# Patient Record
Sex: Male | Born: 1940 | Race: White | Hispanic: No | Marital: Married | Smoking: Never smoker
Health system: Southern US, Community
[De-identification: ages and names within clinical notes are randomized; demographics above are authoritative.]

## PROBLEM LIST (undated history)

## (undated) DIAGNOSIS — I2583 Coronary atherosclerosis due to lipid rich plaque: Secondary | ICD-10-CM

## (undated) DIAGNOSIS — I10 Essential (primary) hypertension: Secondary | ICD-10-CM

## (undated) DIAGNOSIS — Z950 Presence of cardiac pacemaker: Secondary | ICD-10-CM

## (undated) DIAGNOSIS — I499 Cardiac arrhythmia, unspecified: Secondary | ICD-10-CM

## (undated) DIAGNOSIS — I4891 Unspecified atrial fibrillation: Secondary | ICD-10-CM

## (undated) DIAGNOSIS — M1990 Unspecified osteoarthritis, unspecified site: Secondary | ICD-10-CM

## (undated) DIAGNOSIS — E785 Hyperlipidemia, unspecified: Secondary | ICD-10-CM

## (undated) DIAGNOSIS — I2510 Atherosclerotic heart disease of native coronary artery without angina pectoris: Secondary | ICD-10-CM

## (undated) DIAGNOSIS — E039 Hypothyroidism, unspecified: Secondary | ICD-10-CM

## (undated) HISTORY — DX: Coronary atherosclerosis due to lipid rich plaque: I25.83

## (undated) HISTORY — DX: Atherosclerotic heart disease of native coronary artery without angina pectoris: I25.10

## (undated) HISTORY — PX: VENTRICULAR CARDIAC PACEMAKER INSERTION: SHX836

## (undated) HISTORY — PX: CORONARY ARTERY BYPASS GRAFT: SHX141

## (undated) HISTORY — PX: INSERT / REPLACE / REMOVE PACEMAKER: SUR710

## (undated) HISTORY — PX: HERNIA REPAIR: SHX51

## (undated) HISTORY — PX: OTHER SURGICAL HISTORY: SHX169

## (undated) HISTORY — PX: HIP FRACTURE SURGERY: SHX118

## (undated) HISTORY — PX: BACK SURGERY: SHX140

## (undated) HISTORY — PX: FRACTURE SURGERY: SHX138

## (undated) HISTORY — DX: Hyperlipidemia, unspecified: E78.5

## (undated) HISTORY — PX: CATARACT EXTRACTION W/ INTRAOCULAR LENS IMPLANT: SHX1309

## (undated) HISTORY — DX: Presence of cardiac pacemaker: Z95.0

---

## 2000-12-08 ENCOUNTER — Ambulatory Visit (HOSPITAL_BASED_OUTPATIENT_CLINIC_OR_DEPARTMENT_OTHER): Admission: RE | Admit: 2000-12-08 | Discharge: 2000-12-08 | Payer: Self-pay | Admitting: Orthopedic Surgery

## 2001-03-16 ENCOUNTER — Ambulatory Visit (HOSPITAL_COMMUNITY): Admission: RE | Admit: 2001-03-16 | Discharge: 2001-03-16 | Payer: Self-pay

## 2004-06-22 ENCOUNTER — Inpatient Hospital Stay (HOSPITAL_COMMUNITY): Admission: EM | Admit: 2004-06-22 | Discharge: 2004-06-27 | Payer: Self-pay | Admitting: *Deleted

## 2004-08-21 DIAGNOSIS — Z951 Presence of aortocoronary bypass graft: Secondary | ICD-10-CM | POA: Insufficient documentation

## 2005-04-02 ENCOUNTER — Ambulatory Visit: Payer: Self-pay | Admitting: *Deleted

## 2005-05-17 ENCOUNTER — Ambulatory Visit (HOSPITAL_COMMUNITY): Admission: RE | Admit: 2005-05-17 | Discharge: 2005-05-17 | Payer: Self-pay

## 2005-07-18 ENCOUNTER — Ambulatory Visit: Payer: Self-pay | Admitting: Internal Medicine

## 2005-07-22 ENCOUNTER — Ambulatory Visit: Payer: Self-pay | Admitting: Internal Medicine

## 2005-09-09 ENCOUNTER — Ambulatory Visit: Payer: Self-pay | Admitting: Internal Medicine

## 2005-10-08 IMAGING — CR DG HIP (WITH OR WITHOUT PELVIS) 2-3V*L*
2 series · 2 of 2 positions shown · non-contrast
Comparison: none

CLINICAL DATA: Left pain after a fall.   
 LEFT HIP, TWO VIEWS ? 06/22/04:
 There is a displaced intertrochanteric fracture of the proximal left femur.

[view not recorded (1 of 2)]
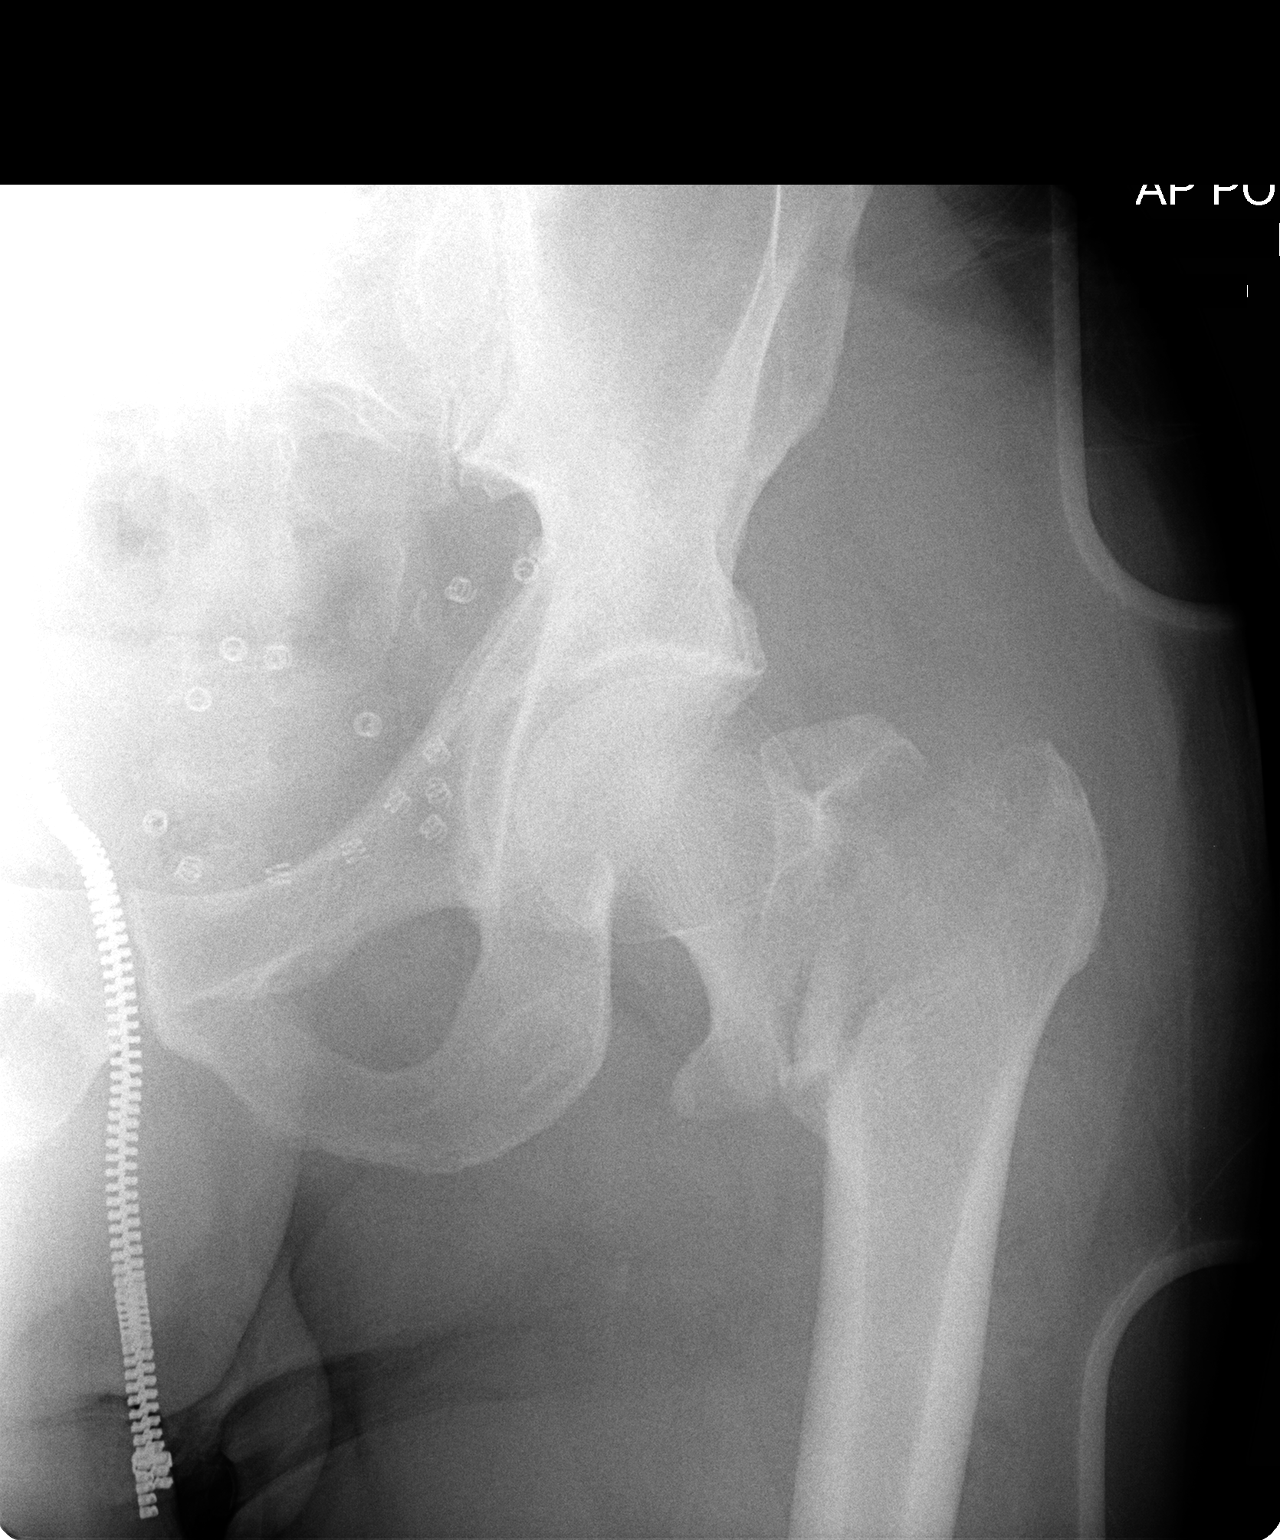

[view not recorded (2 of 2)]
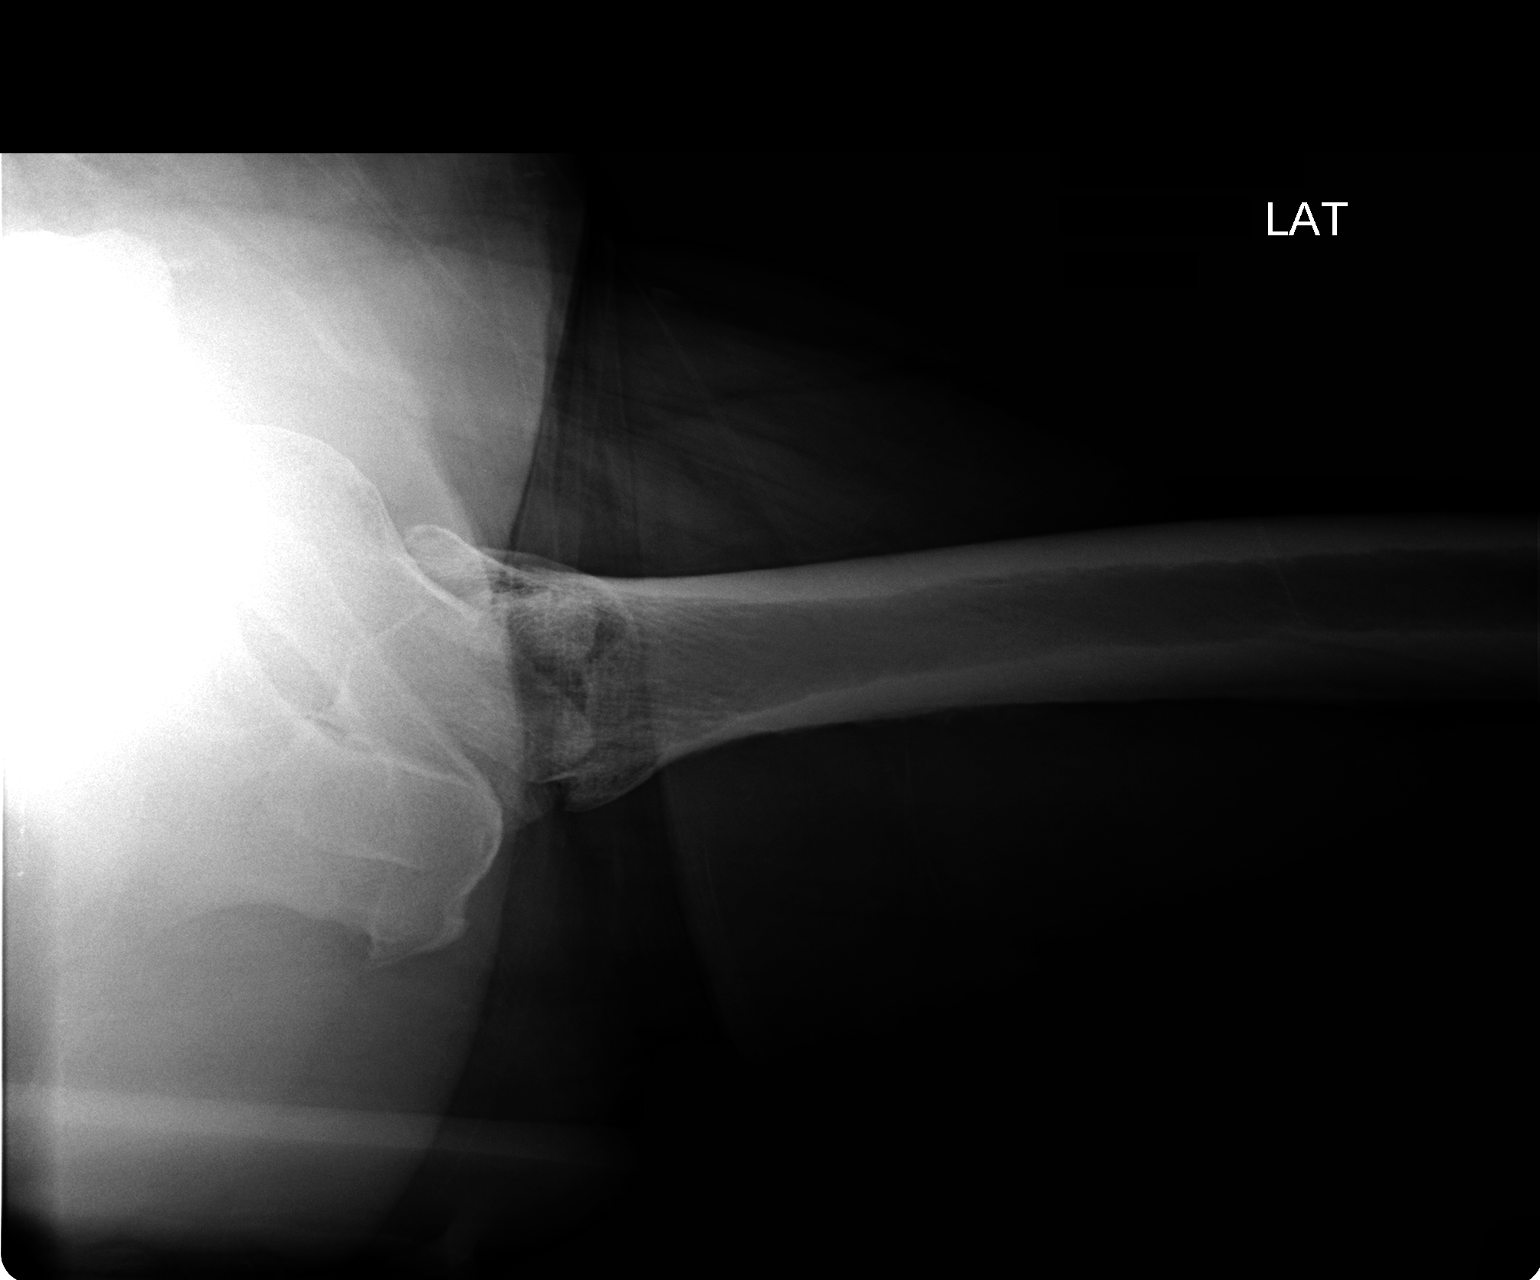

[2 of 2 positions shown; findings below may reference images not displayed]

IMPRESSION: Intertrochanteric fracture of the proximal left femur.

## 2006-05-15 ENCOUNTER — Ambulatory Visit: Payer: Self-pay | Admitting: *Deleted

## 2007-08-31 IMAGING — CR DG SHOULDER 3+V*L*
1 series · 3 of 3 positions shown · non-contrast
Comparison: none

REASON FOR EXAM: pain
COMMENTS:

PROCEDURE:     DXR - DXR SHOULDER LEFT COMPLETE  - May 15, 2006  [DATE]
RESULT:          Views of the LEFT shoulder reveal no evidence of fracture
or dislocation nor significant degenerative change.  The overlying soft
tissues are normal in appearance.

[Series 1: view not recorded · 0.17mm/px · 3 of 3 slices shown]
[im 1/3]
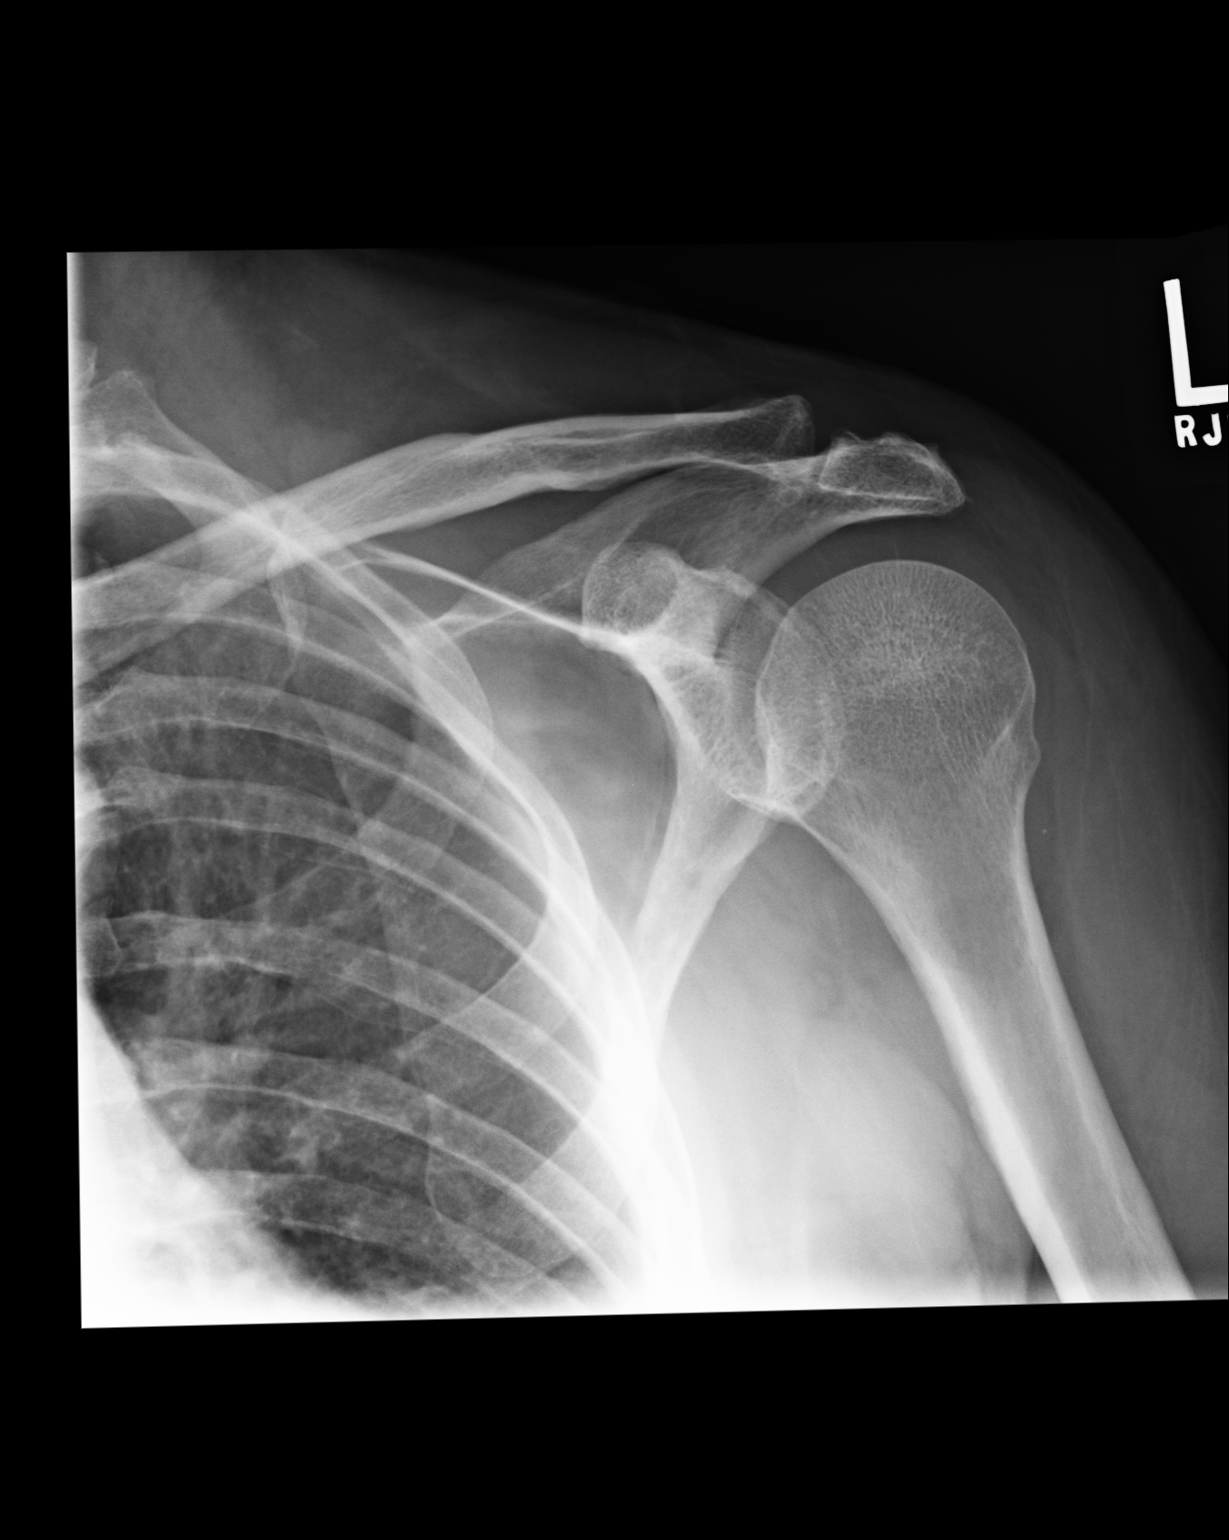
[im 2/3]
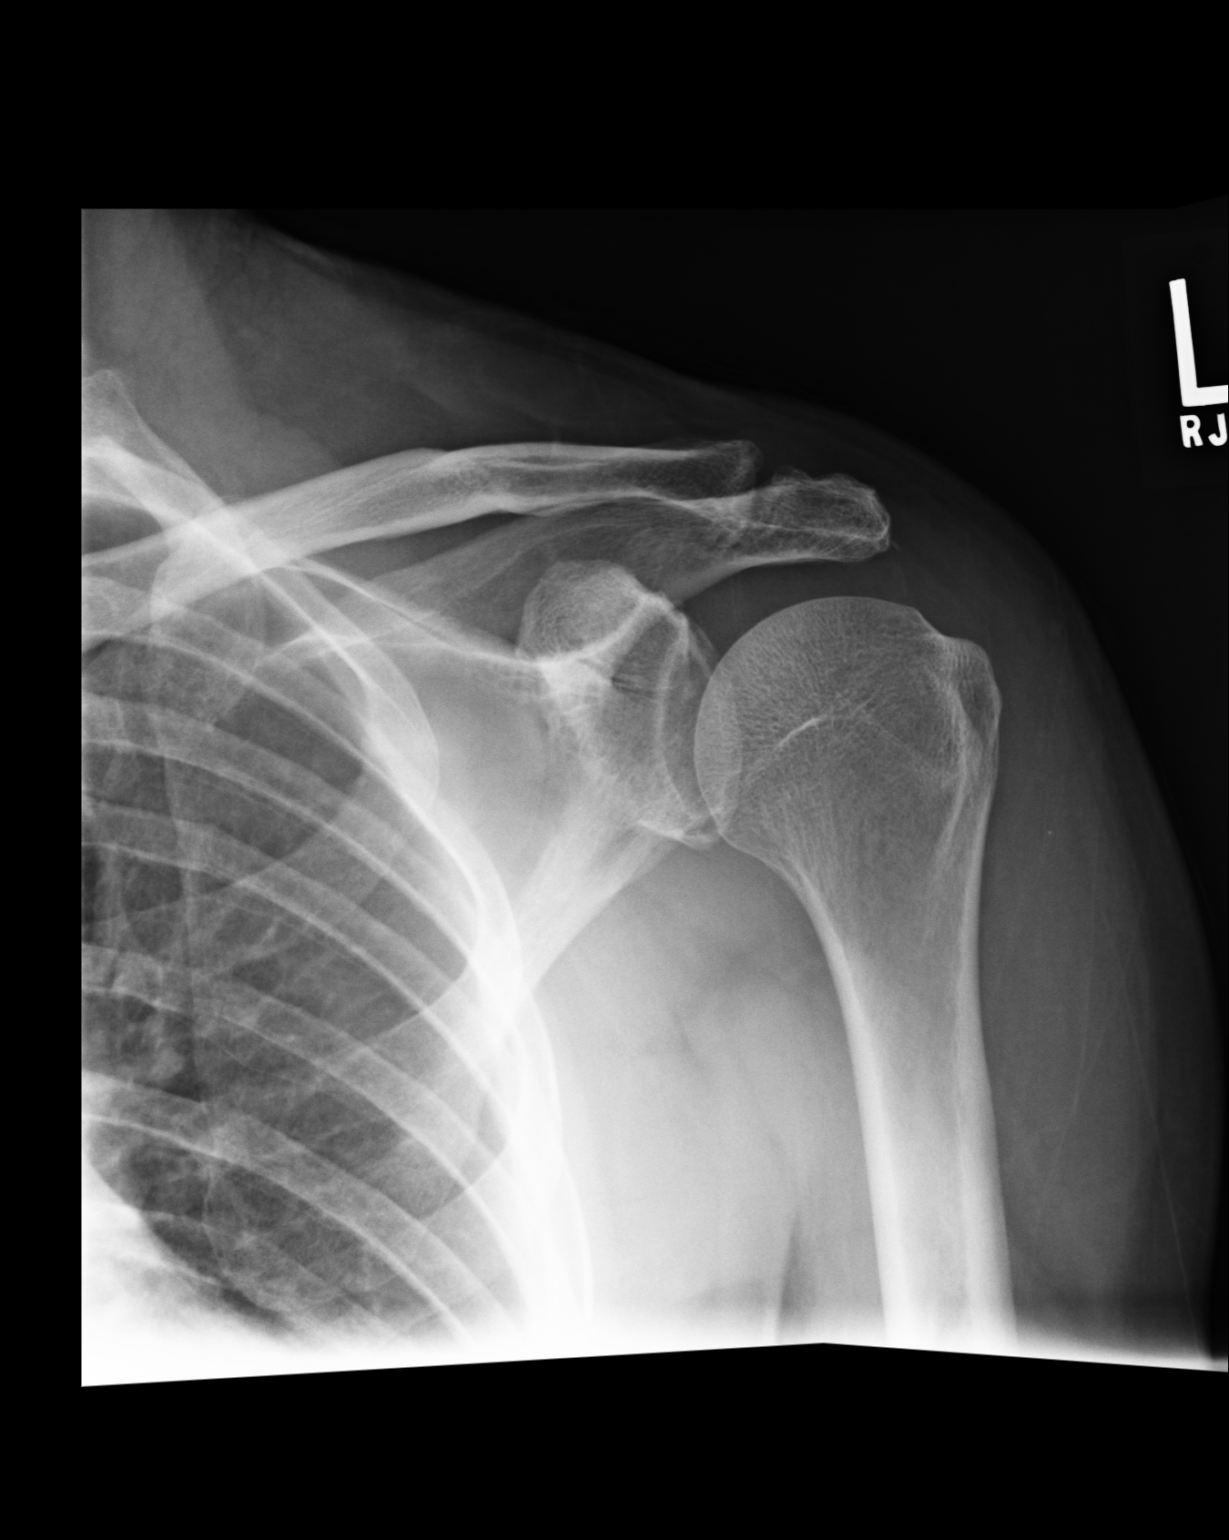
[im 3/3]
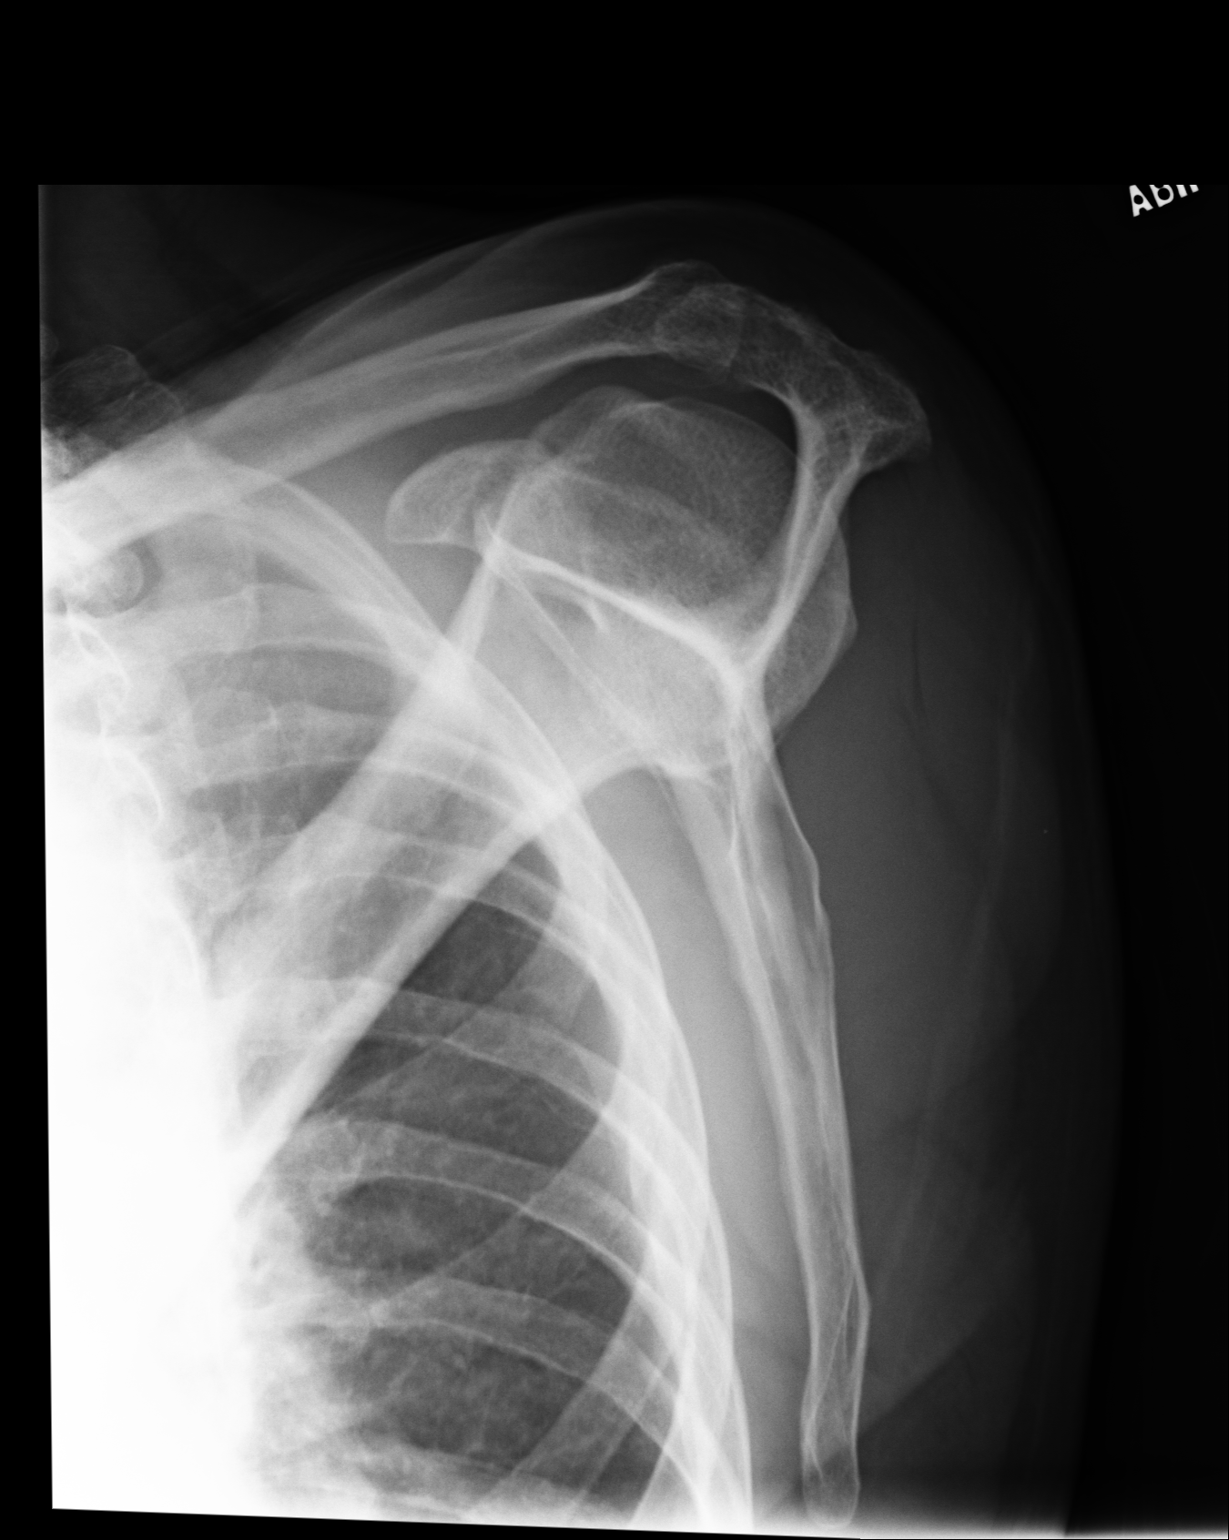

[3 of 3 positions shown; findings below may reference images not displayed]

IMPRESSION: I see no acute bony abnormality of the LEFT shoulder.
If rotator cuff pathology is suspected, further evaluation with MRI would be
a useful next step.

## 2008-07-05 ENCOUNTER — Ambulatory Visit: Payer: Self-pay | Admitting: Gastroenterology

## 2009-04-10 ENCOUNTER — Ambulatory Visit: Payer: Self-pay | Admitting: Family Medicine

## 2009-10-05 ENCOUNTER — Ambulatory Visit (HOSPITAL_COMMUNITY): Admission: RE | Admit: 2009-10-05 | Discharge: 2009-10-05 | Payer: Self-pay | Admitting: Family Medicine

## 2010-12-28 NOTE — Discharge Summary (Signed)
NAME:  [name], [name]               ACCOUNT NO.:  [id number]   MEDICAL RECORD NO.:  [id number]          PATIENT TYPE:  INP   LOCATION:  A329                          FACILITY:  [facility]   PHYSICIAN:  J. Darreld Mclean, M.D. DATE OF BIRTH:  07-31-1941   DATE OF ADMISSION:  06/22/2004  DATE OF DISCHARGE:  11/16/2005LH                                 DISCHARGE SUMMARY   DISCHARGE DIAGNOSES:  1.  Comminuted displaced intertrochanteric fracture of the left hip.  2.  History of hypertension.   OPERATION:  Open treatment, internal fixation of the left intertrochanteric  fracture.   DISPOSITION:  The patient is discharged home, is to follow-up in the office  on July 03, 2004 for removal of staples from his left hip wound with x-  rays of his hip at that time.   DISCHARGE STATUS:  Improved.   DISCHARGE PROGNOSIS:  Good.   DISCHARGE MEDICATIONS:  1.  Vicodin ES one tab q.4 h. p.r.n. for pain.  2.  Levaquin 500 mg one p.o. every day times 10 days.   BRIEF HISTORY AND PHYSICAL:  Please refer to the typewritten history and  physical in the patient's chart.   HOSPITAL COURSE:  On the day of admission, the patient was taken to the  operating room where he underwent open treatment, internal fixation of the  left hip under sterile anesthetic.  He tolerated the procedure well and lost  approximately 200 cc of blood.   Following procedure, he was admitted to the hospital for pain control and  physical therapy.  On his first postoperative day, he was afebrile.  Neurovascular was intact to his left lower extremity.  Labs were within  normal limits.  He was seen by physical therapy and did reasonably well  getting out of bed and working with therapy.   His hypertension was managed by one of the hospitalists here at the  hospital.   The patient continued to do well, remained afebrile in stable condition  throughout his stay.  At the time of discharge, he is walking 30+ feet with  standby  assistance only with a walker.  His wound was inspected on the third  postoperative day and did not show any erythema or drainage.   The patient was discharged home on June 27, 2004, afebrile, and in  satisfactory condition.  Because on the day of discharge he had a slight  redness around the staples to the wound, he was placed on Levaquin for very  superficial cellulitis.  He was asked to call to the hospital beeper system  if he should have any problems.  Otherwise, he will call the office.   He is to be followed by home health provided by [facility].   He has been given the following instructions:  1.  Continue toe-touch weightbearing of the left lower extremity using a      walker at all times.  2.  Keep the wound to his left hip clean and dry.      BC/MEDQ  D:  07/04/2004  T:  07/04/2004  Job:  [id number] 

## 2010-12-28 NOTE — H&P (Signed)
NAME:  [name], [name]               ACCOUNT NO.:  [id number]   MEDICAL RECORD NO.:  [id number]          PATIENT TYPE:  EMS   LOCATION:  ED                            FACILITY:  [facility]   PHYSICIAN:  J. Darreld Mclean, M.D. DATE OF BIRTH:  May 22, 1941   DATE OF ADMISSION:  06/22/2004  DATE OF DISCHARGE:  LH                                HISTORY & PHYSICAL   CHIEF COMPLAINT:   I fell, hurt my hip.   HISTORY OF PRESENT ILLNESS:  The patient is a 71 year old male [name] who  fell in the grass early this morning; did sort of a split and fell and  injured his left hip.  He was brought to the hospital because he could not  stand or walk.  X-ray showed an introchanteric fracture of the left hip. No  other apparent injuries.   The patient has a history of hypertension.  His family doctor is in [place]. He is currently taking Lopressor 50 mg b.i.d., Accupril 20 mg daily,  1 aspirin a day, and  Ranitidine 150 mg b.i.d.   PAST MEDICAL HISTORY:  1.  Positive for hypertension.  2.  States he had a little mini stroke several years ago.  3.  Otherwise he is in good health. Denies lung disease or heart disease, GI      bleeding, or kidney problems.  4.  Previous left knee injury years ago with a fractured patella, fractured      collar bone.   SURGICAL HISTORY:  He has had a herniorrhaphy.   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   PHYSICAL EXAMINATION:  VITAL SIGNS:  Within normal limits.  HEENT:  Negative. He is alert, cooperative and oriented.  NECK:  Supple.  LUNGS:  Clear to P&A.  HEART:  Regular rhythm without murmur heard.  ABDOMEN:  Obese, soft, nontender without masses.  EXTREMITIES:  Pain and tenderness to the left leg and no shortening. No  significant ecchymosis. Other extremities are within normal limits.  CNS:  Intact.  SKIN:  Intact.   IMPRESSION:  1.  Acute intratrochanteric fracture left hip.  2.  History of hypertension.   PLAN:  Open treatment, internal  reduction of the hip left. Discussed with  patient and family risk and imponderables including infection, blood clots  which could lead to death, pulmonary embolism, degenerative arthritis, need  for physical therapy, possibility of blood transfusion, need for Home Health  therapy. Possibility of spinal anesthesia. The appear to understand and  agree to procedure as outlined.  Labs are pending.      JWK/MEDQ  D:  06/22/2004  T:  06/22/2004  Job:  [id number]

## 2010-12-28 NOTE — Op Note (Signed)
NAME:  [name], [name]               ACCOUNT NO.:  [id number]   MEDICAL RECORD NO.:  [id number]          PATIENT TYPE:  INP   LOCATION:  A329                          FACILITY:  [facility]   PHYSICIAN:  J. Darreld Mclean, M.D. DATE OF BIRTH:  [birth year]   DATE OF PROCEDURE:  06/22/2004  DATE OF DISCHARGE:                                 OPERATIVE REPORT   PREOPERATIVE DIAGNOSIS:  Intertrochanteric fracture of the hip on the left.   POSTOPERATIVE DIAGNOSIS:  Intertrochanteric fracture of the hip on the left.   PROCEDURE:  Open treatment, internal reduction of the hip on the left using  an Omega hip system with 135 degree short-barrel four-hole side plate and a  95 mm long compression screw.   SURGEON:  J. Darreld Mclean, M.D.   ANESTHESIA:  Spinal.   ESTIMATED BLOOD LOSS:  150-200 mL.  None replaced.   One Hemovac drain used.  No apparent complications.   INDICATIONS:  The patient is a 70 year old male [name] who fell today and  injured his left hip.  He was seen and evaluated in the emergency room.  Risks and imponderables were discussed with the patient and his family.  They appeared to understand and agree with the procedure as outlined.   DESCRIPTION OF PROCEDURE:  The patient was seen in the holding area and the  left hip was identified as the correct surgical side.  I placed a mark on  the hip, so did the patient.  The patient brought to the operating room and  given spinal anesthesia and transferred to the fracture table.  The left hip  was identified again as the correct site.  The patient was placed in  traction while on the fracture table.  C-arm fluoroscopy unit was brought  forward.  Position and alignment appeared to be satisfactory.  The patient  was prepped and draped in the usual manner.  Incision made through skin and  subcutaneous tissue, tensor fascia lata, vastus lateralis.  The femoral  shaft is identified.  The patient's fracture was reduced as best as we could  get it reduced and then a guide pin placed, measured 95 mm with good AP and  lateral views.  A step drill was used, 95 mm compression screw was inserted,  and a four-hole short-barrel 135 degree side plate was used.  Drill holes  were made.  Screws measured from 46 mm to 40 mm.  Compression was applied to  the system.  Permanent pictures were taken.  This looked good in AP and  lateral views.  A Hemovac drain was placed, sewn in with 2-0 silk.  The  vastus lateralis reapproximated with sutures of running #1  Bralon, the tensor fascia lata was reapproximated using #1 Bralon  interrupted figure-of-eight, subcutaneous tissue closed with 2-0 plain, and  skin reapproximated with skin staples.  The patient tolerated the procedure  well and went to recovery in good condition.      JWK/MEDQ  D:  06/22/2004  T:  06/22/2004  Job:  [id number]

## 2010-12-28 NOTE — Op Note (Signed)
[place]  Patient:    [name], [name]                        MRN: [id number] Proc. Date: 12/08/00 Adm. Date:  [id number] Attending:  Milly Jakob                           Operative Report  DATE OF BIRTH:  March 30, 1942.  PREOPERATIVE DIAGNOSIS:  Carpal tunnel syndrome with flexor carpi radialis tendinitis.  POSTOPERATIVE DIAGNOSES: 1. Carpal tunnel syndrome. 2. Complete rupture of flexor carpi radialis tendon with stenosing    tenosynovitis.  PROCEDURES: 1. Carpal tunnel release. 2. Debridement of flexor carpi radialis tendon.  BRIEF HISTORY:  He is a 70 year old male with about a five-month history of having an injury at work.  He began complaining of significant pain in the area of his flexor carpi radialis tendon enough so that we ultimately got an MRI, which showed that he had tenosynovitis around the flexor carpi radialis tendon.  He ultimately was further worked up and noted to have carpal tunnel syndrome via EMGs.  Because of continued complaints of pain, especially with wrist flexion, the patient was ultimately taken to the operating room for carpal tunnel release and debridement of flexor carpi radialis tendon.  DESCRIPTION OF PROCEDURE:  The patient was taken to the operating room and after adequate anesthesia was obtained with a general anesthetic, the patient was placed supine upon the operating table.  The right arm was then prepped and draped in the usual sterile fashion.  Following this, Esmarch exsanguination, blood pressure tourniquet inflated to 250 mmHg.  Following this, a curved incision was made just ulnar to the midline wrist crease. Subcutaneous tissue taken down to the level of the volar carpal ligament, which was identified and divided in both directions.  A gloved finger could be put in the wound in both directions.  Following this, the wound was copiously irrigated and suctioned dry.  Following this,  attention was turned to the FCR tendon, where a curvilinear incision was made.  Care was taken to protect the radial artery and nerve and also to protect the palmar cutaneous branch of the median nerve.  Following this, an incision was made over the flexor carpi radialis tendon and the tenosynovitis was identified and also some hematoma within the substance of the tendon.  On further dissection, it was clear that the tendon was not attached, and it was just laying free in the sheath.  At this point, the tendon was cleaned up and identified.  It was pulled distally. A check was made in the bony canal for the flexor carpi radialis and there was no other tendon end, which was clear that it probably was avulsed directly from bone.  At this point, thoughts were given to repair, although given the five-month time lag, it seemed like this would not be something that could be undertaken and given that the tendon is frequently harvested for tendon transfers as well as for anchovy procedure, it was felt that debridement was the most appropriate course of action.  I did call Nicki Reaper, M.D., intraoperatively, who is one of the hand surgeons at [facility], to run his thoughts about the appropriateness of trying to repair at five months versus debridement.  It was his feeling that debridement was the most appropriate course of action, and we agreed with that and  ultimately we debrided the flexor carpi radialis tendon.  At that point, the wound was copiously irrigated and suctioned dry.  The wound had to be connected during the case to further delineate the pathology.  The subcutaneous areas were closed with a 4-0 Vicryl interrupted suture and the skin with a 4-0 nylon suture.  A sterile compressive dressing was applied as well as a volar plaster, and the patient was taken to the recovery room, where he was noted to be in satisfactory condition. DD:  12/08/00 TD:   12/09/00 Job: [id number]

## 2010-12-28 NOTE — Consult Note (Signed)
[name], [name]               ACCOUNT NO.:  [id number]   MEDICAL RECORD NO.:  [id number]          PATIENT TYPE:  INP   LOCATION:  A329                          FACILITY:  [facility]   PHYSICIAN:  Vania Rea, M.D. DATE OF BIRTH:  02/02/1941   DATE OF CONSULTATION:  06/22/2004  DATE OF DISCHARGE:                                   CONSULTATION   PHYSICIAN REQUESTING THE CONSULT:  Dr. Darreld Mclean.   PRIMARY CARE PHYSICIAN:  Dr. Regino Schultze at [facility] in [place].   REASON FOR CONSULTATION:  Preoperative evaluation for open reduction and  internal fixation of left hip.   HISTORY OF PRESENT ILLNESS:  This is a 70 year old Caucasian man who was  admitted through the emergency room this morning after he fell and injured  his left hip.  An x-ray revealed intertrochanteric fracture of the left  femur.   The patient does have a history of hypertension which is treated with beta-  blockers and ACE inhibitor, he has no history of diabetes or kidney disease.  He does have a history of transient right-sided weakness lasting a few  minutes, back in the 1980s, for which he was evaluated with echocardiogram,  cardiac catheterization, and carotid Dopplers.  The patient says his cardiac  catheterization back in the 1980s was completely normal, the carotid  Dopplers done about 4-5 years ago revealed no evidence of stenosis, and his  echo was normal.  He has never had cardiac irregularity.  He has never had  syncope.  He has never had a myocardial infarction.   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  He says he can be  pretty much as active as he wants to be, although he is somewhat limited by  a deformity of his left knee status post repair of a fractured patella.  He  goes up two flights of stairs easily without any difficulty, and he can walk  four blocks easily without any respiratory difficulty.  He has no orthopnea,  PND, or lower extremity swelling.  He never had  chest pains.  Does not  smoke.   PAST MEDICAL HISTORY:  1.  Significant for hypertension.  2.  Significant for TIA in 1980s.  3.  Past history of fracture of the patella and fracture of the collar bone.   MEDICATIONS:  1.  Lopressor 50 mg twice daily.  2.  Accupril 20 mg daily.  3.  Aspirin 325 mg daily.  4.  Ranitidine 150 mg twice daily.   ALLERGIES:  NO KNOWN ALLERGIES.   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  Discontinued tobacco use 25 years ago.  Drinks alcohol very  occasionally.  No illicit drug use.   FAMILY HISTORY:  His mother died at age 69 from heart disease.  His father  died at age 46 from CA of the prostate.  Also had occupational lung disease  from mining.  He has six siblings with a significant history of heart  disease, but at age 45 he had two  brothers who died from myocardial  infarction, one sister had heart problems associated with conduction  irregularities.   REVIEW OF SYSTEMS:  Denies headaches, does have sinusitis and post nasal  drip.  No thyroid problems.  No eye problems or neck problems.  Denies chest  pains, fever, cough or cold, shortness of breath.  Denies nausea, vomiting,  diarrhea or constipation.  Denies frequency, dysuria, hematuria, hesitancy,  or incontinence.  Does tend to have pains in the left knee, but has no focal  weakness or syncope.   PHYSICAL EXAMINATION:  This is a very pleasant elderly Caucasian man lying  in bed, alert and oriented.  His pain is adequately controlled on a morphine  pump.  VITAL SIGNS:  Temperature 97.5, pulse 58, respirations 16, blood pressure  155/84, he is saturating 98% on room air.  His height is listed as 5 foot 3  inches, his weight is 165 pounds.  His pupils are round, equal, and reactive  to light.  His mucous membranes are pink and anicteric, and moist.  He has  bilateral parotid hypertrophy, but no lymphadenopathy, no jugular venous  distention, no  thyromegaly.  CHEST:  Clear to auscultation bilaterally.  CARDIOVASCULAR:  Regular rhythm without murmurs.  ABDOMEN:  Obese, soft and nontender.  There is no organomegaly, no masses.  EXTREMITIES:  Without edema.  He has 2+ dorsalis pedis pulse on the right,  has good capillary refill.  His feet are warm.  The left foot is in  traction.  There is a deformity of the left hip according to his position,  but no swelling, no erythema.   LABS:  His ABGs on room air -- his pH is 7.4, pcO2 37, pO2 103, saturating  at 97.6%.  White count is 9.1, hematocrit 42.8, platelets 217,000.  He has a  normal differential on the white count.  PT is 12.1, INR 0.9, PTT 27, sodium  133, potassium 4.3, chloride 103, CO2 26, glucose 147, BUN 11, creatinine  0.8, calcium 9.0.   EKG shows sinus bradycardia with a rate of 58, it is otherwise normal.  There are no ST wave changes.  There is no indication for left ventricular  hypertrophy.  His chest x-ray shows no acute infiltrates.   ASSESSMENT:  1.  Hypertension, suboptimally controlled.  2.  Mild hyperglycemia on a random blood sample.  3.  Past history of transient ischemic attack about 20 years ago.  4.  Acute fracture of the left hip.   RECOMMENDATIONS:  This gentleman presents an intermediate operative risk and  is being prepared for orthopedic surgery.  He can therefore proceed with  surgery without further intervention.  We will follow his capillary blood  sugars post surgery, since he has one value elevated now.  He did have his  Lopressor this morning.  Recommend continued use of beta-blockers in the  perioperative period, may be at increased doses since his blood pressure is  not fully controlled at this time, although the anxiety around the event may  be causing a temporary elevation of his blood pressure.   I would like to thank Dr. Hilda Lias for allowing [place] to participate in the care of this gentleman.  We will follow in the postoperative period  until he is  considered to be stable.     Leop   LC/MEDQ  D:  06/22/2004  T:  06/22/2004  Job:  [id number]   cc:   Teola Bradley, M.D.  [phone]  [place]  [place] [address]  Fax: [phone]

## 2011-01-21 IMAGING — US US CAROTID DUPLEX BILAT
1 series · 13 of 24 positions shown · non-contrast
Comparison: None

CLINICAL DATA: TIA, carotid bruit.  Previous coronary bypass
grafting.

BILATERAL CAROTID DUPLEX ULTRASOUND
TECHNIQUE: Gray scale imaging, color Doppler and duplex ultrasound
was performed of bilateral carotid and vertebral arteries in the
neck.

[Series 1: unknown · 0.09mm/px · 13 of 79 slices shown]
[im 1/79]
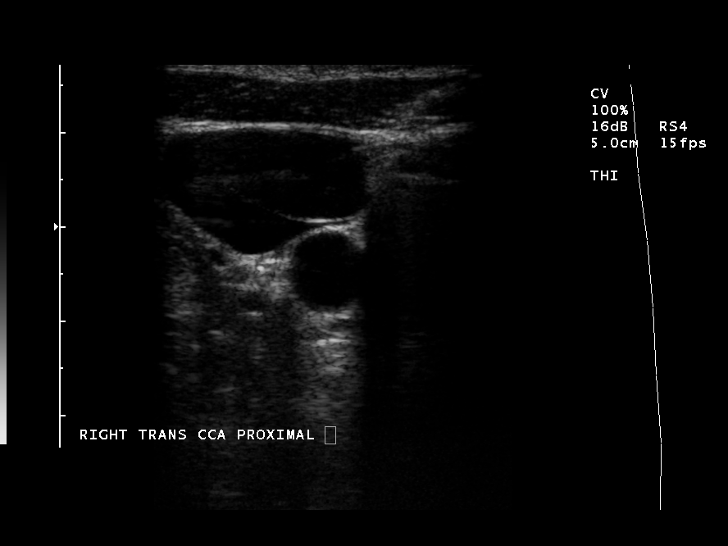
[im 7/79]
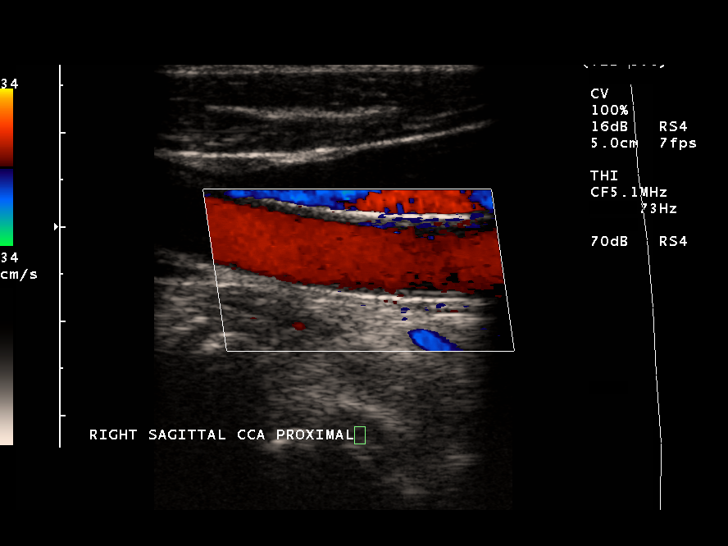
[im 14/79]
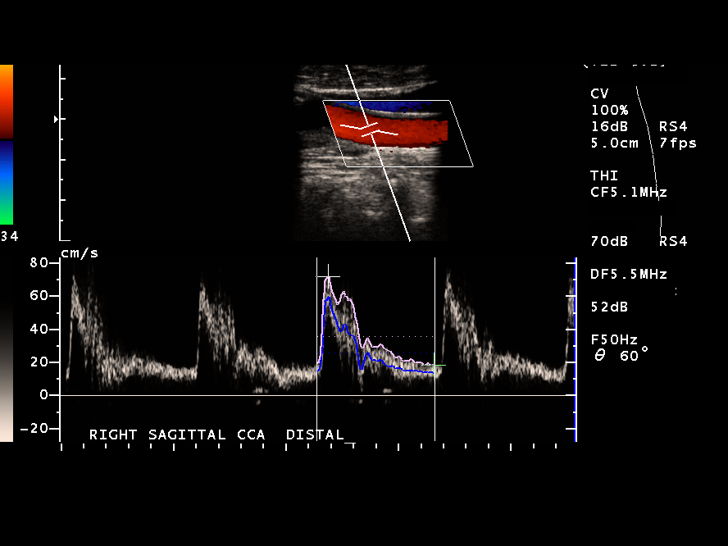
[im 21/79]
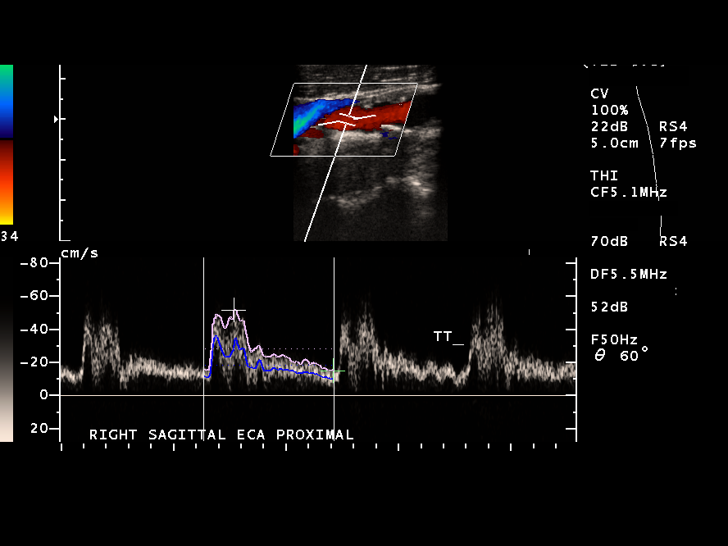
[im 28/79]
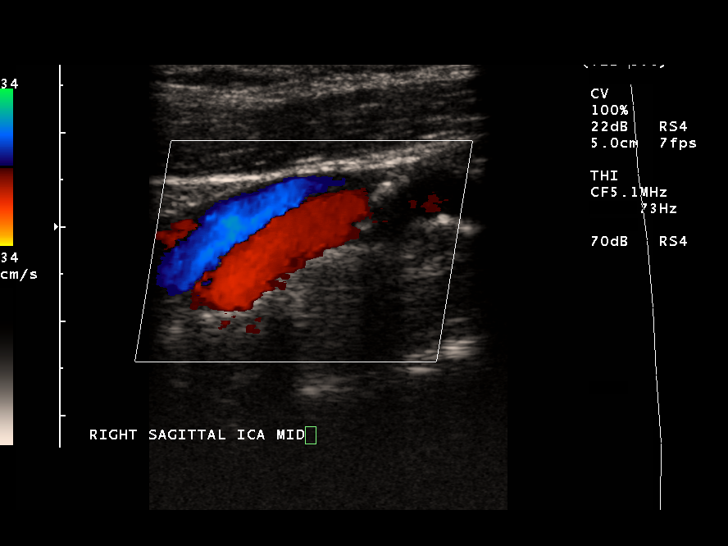
[im 34/79]
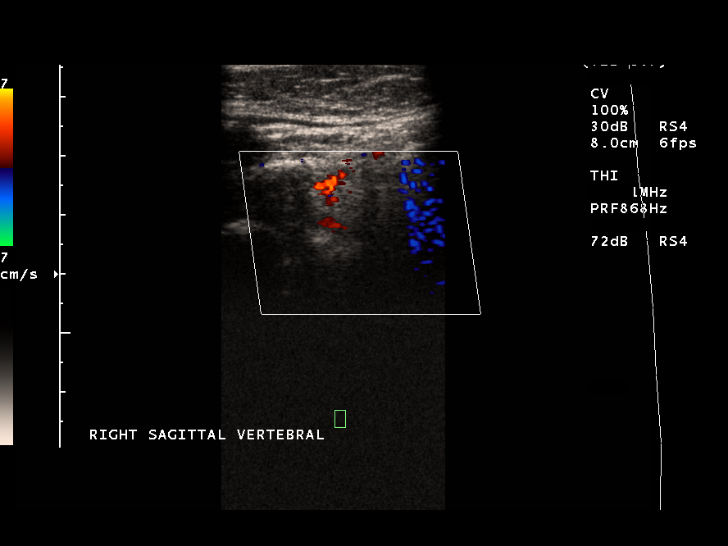
[im 41/79]
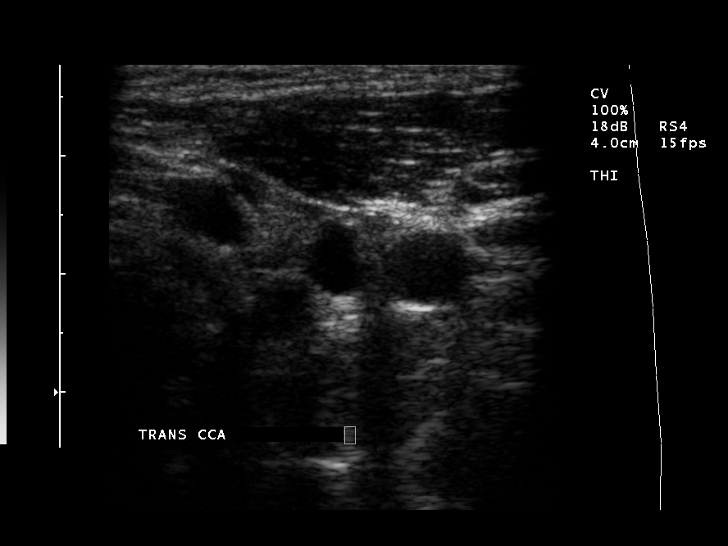
[im 45/79]
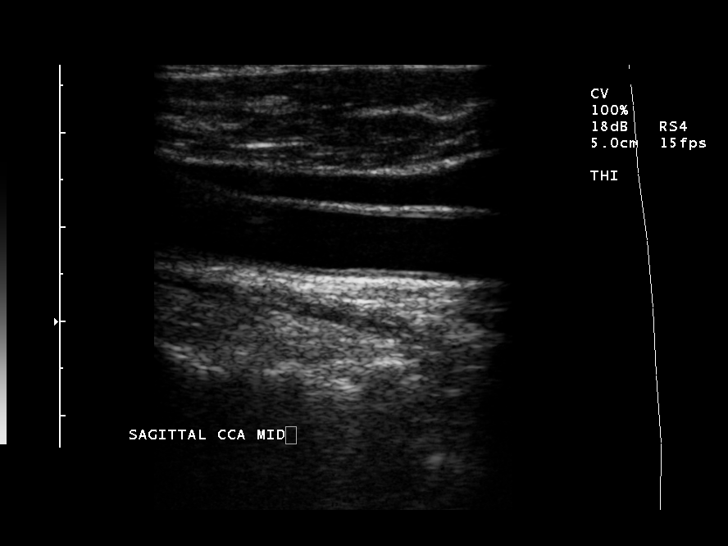
[im 51/79]
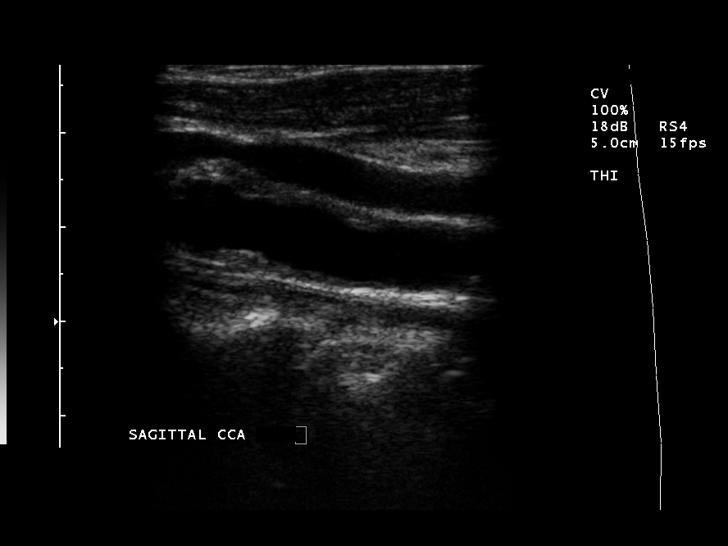
[im 58/79]
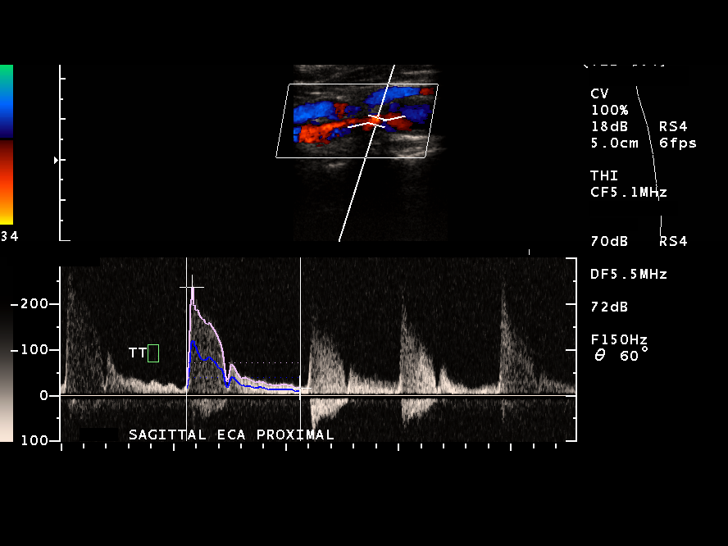
[im 65/79]
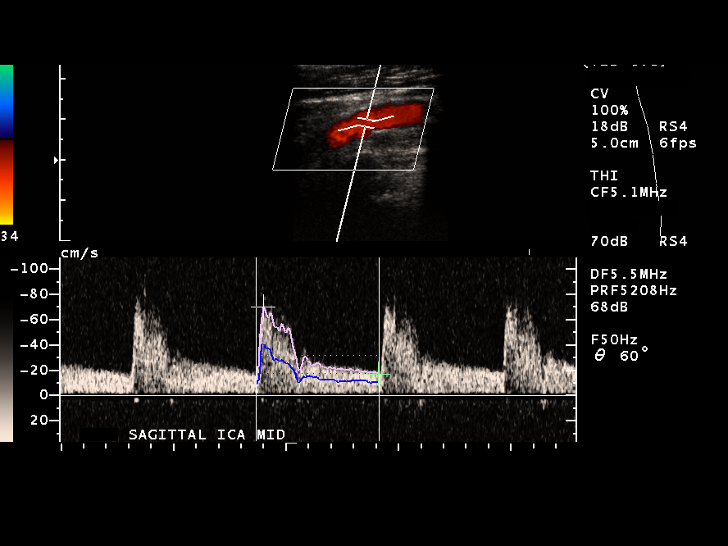
[im 72/79]
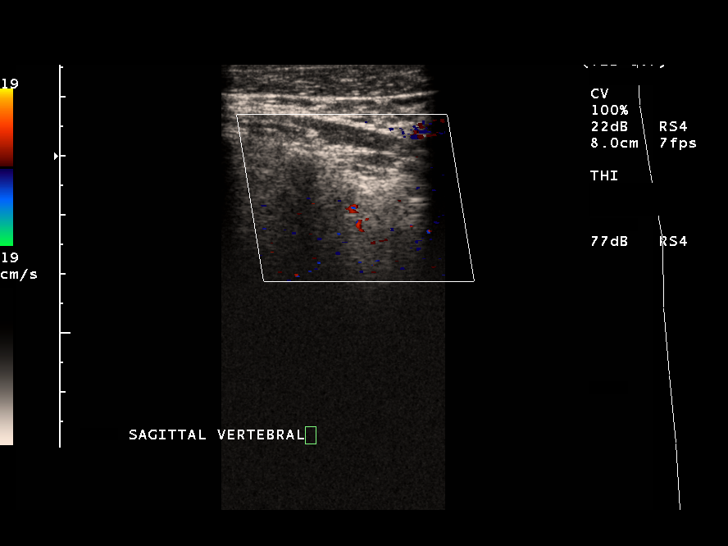
[im 79/79]
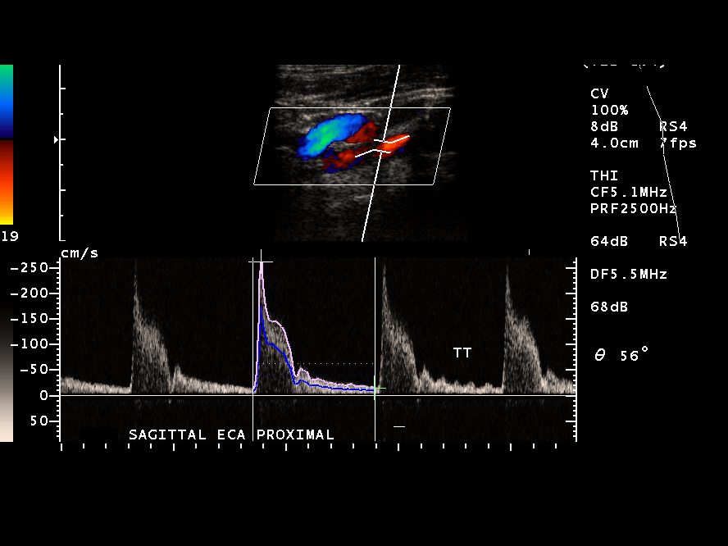

[13 of 24 positions shown; findings below may reference images not displayed]

Criteria:  Quantification of carotid stenosis is based on velocity
parameters that correlate the residual internal carotid diameter
with NASCET-based stenosis levels, using the diameter of the distal
internal carotid lumen as the denominator for stenosis measurement.

The following velocity measurements were obtained:

                 PEAK SYSTOLIC/END DIASTOLIC
RIGHT
ICA:                        89/21cm/sec
CCA:                        77/15cm/sec
SYSTOLIC ICA/CCA RATIO:
DIASTOLIC ICA/CCA RATIO:
ECA:                        51/15cm/sec

LEFT
ICA:                        74/11cm/sec
CCA:                        76/11cm/sec
SYSTOLIC ICA/CCA RATIO:
DIASTOLIC ICA/CCA RATIO:
ECA:                        260/14cm/sec
FINDINGS: RIGHT CAROTID ARTERY: There is calcified plaque in the carotid bulb
extending into proximal internal and external carotid arteries
without high-grade stenosis.  Normal wave forms with no focal
aliasing on color Doppler interrogation.

RIGHT VERTEBRAL ARTERY:  Normal flow direction and waveform.

LEFT CAROTID ARTERY: Partially calcified plaque effaces the carotid
bulb and extends into the proximal internal and external carotid
arteries.  There are elevated peak systolic velocities in the
proximal external carotid artery.  No significant ICA stenosis.

LEFT VERTEBRAL ARTERY:  Normal flow direction and waveform.
IMPRESSION: 1.  Bilateral carotid bifurcation plaque resulting in less than 50%
diameter stenosis. The exam does not exclude plaque ulceration or
embolization.  Continued surveillance recommended.

2.  Left ECA origin stenosis, of doubtful clinical significance.

## 2011-05-01 ENCOUNTER — Other Ambulatory Visit: Payer: Self-pay | Admitting: Internal Medicine

## 2011-10-10 ENCOUNTER — Ambulatory Visit: Payer: Self-pay | Admitting: Internal Medicine

## 2011-11-14 ENCOUNTER — Ambulatory Visit: Payer: Self-pay | Admitting: Internal Medicine

## 2012-03-07 ENCOUNTER — Encounter (HOSPITAL_COMMUNITY): Payer: Self-pay

## 2012-03-07 ENCOUNTER — Emergency Department (HOSPITAL_COMMUNITY)
Admission: EM | Admit: 2012-03-07 | Discharge: 2012-03-07 | Disposition: A | Discharge status: Home / Self Care | Payer: Medicare Other | Attending: Emergency Medicine | Admitting: Emergency Medicine

## 2012-03-07 ENCOUNTER — Emergency Department (HOSPITAL_COMMUNITY): Payer: Medicare Other

## 2012-03-07 DIAGNOSIS — I4891 Unspecified atrial fibrillation: Secondary | ICD-10-CM | POA: Insufficient documentation

## 2012-03-07 DIAGNOSIS — M79609 Pain in unspecified limb: Secondary | ICD-10-CM | POA: Insufficient documentation

## 2012-03-07 DIAGNOSIS — M79672 Pain in left foot: Secondary | ICD-10-CM

## 2012-03-07 DIAGNOSIS — I10 Essential (primary) hypertension: Secondary | ICD-10-CM | POA: Insufficient documentation

## 2012-03-07 HISTORY — DX: Unspecified atrial fibrillation: I48.91

## 2012-03-07 HISTORY — DX: Essential (primary) hypertension: I10

## 2012-03-07 NOTE — ED Notes (Signed)
Pt c/o left foot pain x 4 days after twisting it.

## 2012-03-07 NOTE — ED Provider Notes (Signed)
History    This chart was scribed for Troy Hutching, MD, MD by Smitty Pluck. The patient was seen in room APA05 and the patient's care was started at 7:39AM.   CSN: [id number]  Arrival date & time 03/07/12  7829   First MD Initiated Contact with Patient 03/07/12 0725      Chief Complaint  Patient presents with  . Foot Pain    (Consider location/radiation/quality/duration/timing/severity/associated sxs/prior treatment) The history is provided by the patient.   QUIN MATHENIA is a 71 y.o. male who presents to the Emergency Department complaining of moderate left foot pain onset 3 days ago due to stepping on something while out in the yard weed eating. He reports he twisted his foot but did not fall. Pt reports symptoms have been constant without radiation. Denies any other pain.     Past Medical History  Diagnosis Date  . Atrial fibrillation   . Hypertension     Past Surgical History  Procedure Date  . Open heart surgery   . 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 Hip fracture surgery   . Back surgery     No family history on file.  History  Substance Use Topics  . Smoking status: Never Smoker   . Smokeless tobacco: Not on file  . Alcohol Use: No      Review of Systems  All other systems reviewed and are negative.  10 Systems reviewed and all are negative for acute change except as noted in the HPI.    Allergies  Review of patient's allergies indicates no known allergies.  Home Medications  No current outpatient prescriptions on file.  BP 119/90  Pulse 62  Temp 97.8 F (36.6 C) (Oral)  Resp 18  Ht 5\' 5"  (1.651 m)  Wt 165 lb (74.844 kg)  BMI 27.46 kg/m2  SpO2 96%  Physical Exam  Nursing note and vitals reviewed. Constitutional: He is oriented to person, place, and time. He appears well-developed and well-nourished. No distress.  HENT:  Head: Normocephalic and atraumatic.  Eyes: Conjunctivae and EOM are normal. Pupils are equal, round, and reactive to light.    Neck: Normal range of motion. Neck supple.  Cardiovascular: Normal rate, regular rhythm and normal heart sounds.   Pulmonary/Chest: Effort normal and breath sounds normal. No respiratory distress.  Abdominal: Soft. Bowel sounds are normal.  Musculoskeletal: Normal range of motion.       Tenderness on left lateral calcaneous   Neurological: He is alert and oriented to person, place, and time.  Skin: Skin is warm and dry.  Psychiatric: He has a normal mood and affect. His behavior is normal.    ED Course  Procedures (including critical care time) DIAGNOSTIC STUDIES: Oxygen Saturation is 96% on room air, normal by my interpretation.    COORDINATION OF CARE: 7:52AM EDP discusses pt ED treatment with pt (xray)    Labs Reviewed - No data to display Dg Ankle Complete Left  03/07/2012  *RADIOLOGY REPORT*  Clinical Data: Ankle injury.  Ankle pain and swelling.  LEFT ANKLE COMPLETE - 3+ VIEW  Comparison: None.  Findings: Mild soft tissue swelling is noted.  No evidence of fracture or dislocation.  No evidence of ankle joint effusion. Mild osteoarthritis is seen involving the ankle joint.  IMPRESSION:  1.  Mild soft tissue swelling.  No evidence of fracture. 2.  Mild ankle osteoarthritis.  Original Report Authenticated By: Danae Orleans, M.D.     No diagnosis found.  MDM  X-ray of left foot shows no fracture. Ankle brace, ice, elevate, Tylenol or Motrin   I personally performed the services described in this documentation, which was scribed in my presence. The recorded information has been reviewed and considered.       Troy Hutching, MD 03/07/12 [id number]

## 2012-03-07 NOTE — ED Notes (Signed)
Pt reports was weed eating Wednesday and stepped on  Something sticking up out of the ground.  C/O pain to left ankle and left side of foot.

## 2012-03-07 NOTE — ED Notes (Signed)
RN at bedside

## 2012-08-03 DIAGNOSIS — H11009 Unspecified pterygium of unspecified eye: Secondary | ICD-10-CM | POA: Insufficient documentation

## 2012-08-03 DIAGNOSIS — IMO0002 Reserved for concepts with insufficient information to code with codable children: Secondary | ICD-10-CM | POA: Insufficient documentation

## 2012-11-19 ENCOUNTER — Ambulatory Visit: Payer: Self-pay | Admitting: Internal Medicine

## 2012-11-19 LAB — PROTIME-INR
INR: 2.3
Prothrombin Time: 25.1 s — ABNORMAL HIGH

## 2014-08-29 DIAGNOSIS — E663 Overweight: Secondary | ICD-10-CM | POA: Diagnosis not present

## 2014-08-29 DIAGNOSIS — G894 Chronic pain syndrome: Secondary | ICD-10-CM | POA: Diagnosis not present

## 2014-08-29 DIAGNOSIS — Z6826 Body mass index (BMI) 26.0-26.9, adult: Secondary | ICD-10-CM | POA: Diagnosis not present

## 2014-08-29 DIAGNOSIS — M1991 Primary osteoarthritis, unspecified site: Secondary | ICD-10-CM | POA: Diagnosis not present

## 2014-08-29 DIAGNOSIS — Z7901 Long term (current) use of anticoagulants: Secondary | ICD-10-CM | POA: Diagnosis not present

## 2014-09-06 DIAGNOSIS — I2510 Atherosclerotic heart disease of native coronary artery without angina pectoris: Secondary | ICD-10-CM | POA: Diagnosis not present

## 2014-09-06 DIAGNOSIS — R079 Chest pain, unspecified: Secondary | ICD-10-CM | POA: Diagnosis not present

## 2014-09-06 DIAGNOSIS — R002 Palpitations: Secondary | ICD-10-CM | POA: Diagnosis not present

## 2014-09-06 DIAGNOSIS — R011 Cardiac murmur, unspecified: Secondary | ICD-10-CM | POA: Diagnosis not present

## 2014-09-06 DIAGNOSIS — R0602 Shortness of breath: Secondary | ICD-10-CM | POA: Diagnosis not present

## 2014-09-14 DIAGNOSIS — R0602 Shortness of breath: Secondary | ICD-10-CM | POA: Diagnosis not present

## 2014-09-14 DIAGNOSIS — R079 Chest pain, unspecified: Secondary | ICD-10-CM | POA: Diagnosis not present

## 2014-09-20 DIAGNOSIS — I482 Chronic atrial fibrillation: Secondary | ICD-10-CM | POA: Diagnosis not present

## 2014-09-27 DIAGNOSIS — I4891 Unspecified atrial fibrillation: Secondary | ICD-10-CM | POA: Diagnosis not present

## 2014-09-27 DIAGNOSIS — M1991 Primary osteoarthritis, unspecified site: Secondary | ICD-10-CM | POA: Diagnosis not present

## 2014-09-27 DIAGNOSIS — E663 Overweight: Secondary | ICD-10-CM | POA: Diagnosis not present

## 2014-09-27 DIAGNOSIS — G894 Chronic pain syndrome: Secondary | ICD-10-CM | POA: Diagnosis not present

## 2014-09-27 DIAGNOSIS — Z7901 Long term (current) use of anticoagulants: Secondary | ICD-10-CM | POA: Diagnosis not present

## 2014-10-04 DIAGNOSIS — R001 Bradycardia, unspecified: Secondary | ICD-10-CM | POA: Diagnosis not present

## 2014-10-04 DIAGNOSIS — I495 Sick sinus syndrome: Secondary | ICD-10-CM | POA: Diagnosis not present

## 2014-10-04 DIAGNOSIS — I2510 Atherosclerotic heart disease of native coronary artery without angina pectoris: Secondary | ICD-10-CM | POA: Diagnosis not present

## 2014-10-04 DIAGNOSIS — I480 Paroxysmal atrial fibrillation: Secondary | ICD-10-CM | POA: Diagnosis not present

## 2014-10-25 DIAGNOSIS — G894 Chronic pain syndrome: Secondary | ICD-10-CM | POA: Diagnosis not present

## 2014-10-25 DIAGNOSIS — E663 Overweight: Secondary | ICD-10-CM | POA: Diagnosis not present

## 2014-10-25 DIAGNOSIS — Z6826 Body mass index (BMI) 26.0-26.9, adult: Secondary | ICD-10-CM | POA: Diagnosis not present

## 2014-11-03 DIAGNOSIS — R002 Palpitations: Secondary | ICD-10-CM | POA: Diagnosis not present

## 2014-11-17 DIAGNOSIS — H43813 Vitreous degeneration, bilateral: Secondary | ICD-10-CM | POA: Diagnosis not present

## 2014-11-17 DIAGNOSIS — H04123 Dry eye syndrome of bilateral lacrimal glands: Secondary | ICD-10-CM | POA: Diagnosis not present

## 2014-11-17 DIAGNOSIS — Z961 Presence of intraocular lens: Secondary | ICD-10-CM | POA: Diagnosis not present

## 2014-11-17 DIAGNOSIS — H26493 Other secondary cataract, bilateral: Secondary | ICD-10-CM | POA: Diagnosis not present

## 2014-11-25 DIAGNOSIS — G894 Chronic pain syndrome: Secondary | ICD-10-CM | POA: Diagnosis not present

## 2014-11-25 DIAGNOSIS — Z7901 Long term (current) use of anticoagulants: Secondary | ICD-10-CM | POA: Diagnosis not present

## 2014-11-25 DIAGNOSIS — E663 Overweight: Secondary | ICD-10-CM | POA: Diagnosis not present

## 2014-11-25 DIAGNOSIS — N400 Enlarged prostate without lower urinary tract symptoms: Secondary | ICD-10-CM | POA: Diagnosis not present

## 2014-11-25 DIAGNOSIS — I10 Essential (primary) hypertension: Secondary | ICD-10-CM | POA: Diagnosis not present

## 2014-11-25 DIAGNOSIS — I4891 Unspecified atrial fibrillation: Secondary | ICD-10-CM | POA: Diagnosis not present

## 2014-12-23 DIAGNOSIS — G894 Chronic pain syndrome: Secondary | ICD-10-CM | POA: Diagnosis not present

## 2014-12-23 DIAGNOSIS — Z6825 Body mass index (BMI) 25.0-25.9, adult: Secondary | ICD-10-CM | POA: Diagnosis not present

## 2014-12-23 DIAGNOSIS — E663 Overweight: Secondary | ICD-10-CM | POA: Diagnosis not present

## 2015-01-23 DIAGNOSIS — M1991 Primary osteoarthritis, unspecified site: Secondary | ICD-10-CM | POA: Diagnosis not present

## 2015-01-23 DIAGNOSIS — Z6825 Body mass index (BMI) 25.0-25.9, adult: Secondary | ICD-10-CM | POA: Diagnosis not present

## 2015-01-23 DIAGNOSIS — Z7901 Long term (current) use of anticoagulants: Secondary | ICD-10-CM | POA: Diagnosis not present

## 2015-01-23 DIAGNOSIS — E663 Overweight: Secondary | ICD-10-CM | POA: Diagnosis not present

## 2015-01-23 DIAGNOSIS — G894 Chronic pain syndrome: Secondary | ICD-10-CM | POA: Diagnosis not present

## 2015-01-31 DIAGNOSIS — Z7901 Long term (current) use of anticoagulants: Secondary | ICD-10-CM | POA: Diagnosis not present

## 2015-02-22 DIAGNOSIS — G894 Chronic pain syndrome: Secondary | ICD-10-CM | POA: Diagnosis not present

## 2015-02-22 DIAGNOSIS — Z1389 Encounter for screening for other disorder: Secondary | ICD-10-CM | POA: Diagnosis not present

## 2015-02-22 DIAGNOSIS — Z7901 Long term (current) use of anticoagulants: Secondary | ICD-10-CM | POA: Diagnosis not present

## 2015-02-22 DIAGNOSIS — E663 Overweight: Secondary | ICD-10-CM | POA: Diagnosis not present

## 2015-02-22 DIAGNOSIS — Z6825 Body mass index (BMI) 25.0-25.9, adult: Secondary | ICD-10-CM | POA: Diagnosis not present

## 2015-03-21 DIAGNOSIS — I482 Chronic atrial fibrillation: Secondary | ICD-10-CM | POA: Diagnosis not present

## 2015-03-27 DIAGNOSIS — Z1389 Encounter for screening for other disorder: Secondary | ICD-10-CM | POA: Diagnosis not present

## 2015-03-27 DIAGNOSIS — G894 Chronic pain syndrome: Secondary | ICD-10-CM | POA: Diagnosis not present

## 2015-03-27 DIAGNOSIS — Z6825 Body mass index (BMI) 25.0-25.9, adult: Secondary | ICD-10-CM | POA: Diagnosis not present

## 2015-03-27 DIAGNOSIS — E663 Overweight: Secondary | ICD-10-CM | POA: Diagnosis not present

## 2015-03-27 DIAGNOSIS — Z7901 Long term (current) use of anticoagulants: Secondary | ICD-10-CM | POA: Diagnosis not present

## 2015-04-18 DIAGNOSIS — E784 Other hyperlipidemia: Secondary | ICD-10-CM | POA: Diagnosis not present

## 2015-04-18 DIAGNOSIS — I480 Paroxysmal atrial fibrillation: Secondary | ICD-10-CM | POA: Diagnosis not present

## 2015-04-18 DIAGNOSIS — I495 Sick sinus syndrome: Secondary | ICD-10-CM | POA: Diagnosis not present

## 2015-04-18 DIAGNOSIS — I10 Essential (primary) hypertension: Secondary | ICD-10-CM | POA: Diagnosis not present

## 2015-04-18 DIAGNOSIS — G459 Transient cerebral ischemic attack, unspecified: Secondary | ICD-10-CM | POA: Diagnosis not present

## 2015-04-25 DIAGNOSIS — Z7901 Long term (current) use of anticoagulants: Secondary | ICD-10-CM | POA: Diagnosis not present

## 2015-04-25 DIAGNOSIS — Z1389 Encounter for screening for other disorder: Secondary | ICD-10-CM | POA: Diagnosis not present

## 2015-04-25 DIAGNOSIS — E663 Overweight: Secondary | ICD-10-CM | POA: Diagnosis not present

## 2015-04-25 DIAGNOSIS — G894 Chronic pain syndrome: Secondary | ICD-10-CM | POA: Diagnosis not present

## 2015-04-25 DIAGNOSIS — Z6825 Body mass index (BMI) 25.0-25.9, adult: Secondary | ICD-10-CM | POA: Diagnosis not present

## 2015-05-03 ENCOUNTER — Ambulatory Visit (INDEPENDENT_AMBULATORY_CARE_PROVIDER_SITE_OTHER): Payer: Medicare Other | Admitting: Podiatry

## 2015-05-03 ENCOUNTER — Encounter: Payer: Self-pay | Admitting: Podiatry

## 2015-05-03 VITALS — BP 139/73 | HR 68 | Resp 16 | Ht 65.0 in | Wt 155.0 lb

## 2015-05-03 DIAGNOSIS — L600 Ingrowing nail: Secondary | ICD-10-CM | POA: Diagnosis not present

## 2015-05-03 MED ORDER — NEOMYCIN-POLYMYXIN-HC 1 % OT SOLN: OTIC | Status: DC

## 2015-05-03 NOTE — Progress Notes (Signed)
   Subjective:    Patient ID: [name], male    DOB: [birth year], 74 y.o.   MRN: [id number]  HPI: He presents today as a 74 year old male with a chief complaint of ingrown toenail to the tibial border of the second digit right foot. He states that is red and swollen and painful for many months now. He states that he tries remove the edge that is on blood thinner and is concerned about bleeding.    Review of Systems  HENT: Positive for hearing loss and tinnitus.   All other systems reviewed and are negative.      Objective:   Physical Exam: 74 year old male in no acute distress pulses are strongly palpable bilateral. Neurologic sensorium is intact percent was C monofilament. Deep tendon reflexes are brisk and equal bilateral. Muscle strength +5 over 5 dorsiflexion plantar flexors and inverters everters all intrinsic musculature is intact. Orthopedic evaluation demonstrates all joints distal to the ankle level range of motion without crepitation. Cutaneous evaluation demonstrates sharp rated now margins to the tibial border of the second digit of the right foot. No purulence no malodor is noted.      Assessment & Plan:  Assessment: Ingrown nail paronychia second digit right foot.  Plan: Discussed etiology pathology conservative versus surgical therapies. At this point were performed chemical matrixectomy after local anesthesia was achieved. He was given both oral and written wound instructions as per the care and soaking of his toe and I will follow-up with him in 1 week. We also prescribed a Cortisporin otic to apply twice daily after soaking.

## 2015-05-03 NOTE — Patient Instructions (Signed)

## 2015-05-10 ENCOUNTER — Encounter: Payer: Self-pay | Admitting: Podiatry

## 2015-05-10 ENCOUNTER — Ambulatory Visit (INDEPENDENT_AMBULATORY_CARE_PROVIDER_SITE_OTHER): Payer: Medicare Other | Admitting: Podiatry

## 2015-05-10 DIAGNOSIS — L600 Ingrowing nail: Secondary | ICD-10-CM

## 2015-05-10 MED ORDER — CEPHALEXIN 500 MG PO CAPS: 500.0000 mg | ORAL_CAPSULE | Freq: Three times a day (TID) | ORAL | Status: DC

## 2015-05-10 NOTE — Patient Instructions (Signed)

## 2015-05-10 NOTE — Progress Notes (Signed)
He presents today 1 week status post matrixectomy tibial border second digit right foot. He states that he's been soaking in Betadine and apply Neosporin ointment. He states that he keeps it covered. He also states these been mowing the yard.  Objective: Vital signs are stable he is alert and oriented 3. Surgical site tibial border second digit right moderately erythematous mildly tender on palpation no purulence and no malodor. Maceration is present.  Assessment: Mild cellulitis status post matrixectomy tibial border second digit right foot.  Plan: Start Epsom salts and water soaks twice daily discontinue Neosporin ointment covered during the day and leave open at night time.

## 2015-05-19 ENCOUNTER — Encounter: Payer: Self-pay | Admitting: Sports Medicine

## 2015-05-19 ENCOUNTER — Ambulatory Visit (INDEPENDENT_AMBULATORY_CARE_PROVIDER_SITE_OTHER): Payer: Medicare Other | Admitting: Sports Medicine

## 2015-05-19 ENCOUNTER — Other Ambulatory Visit: Payer: Self-pay | Admitting: Sports Medicine

## 2015-05-19 VITALS — BP 154/117 | HR 78 | Resp 17

## 2015-05-19 DIAGNOSIS — L03031 Cellulitis of right toe: Secondary | ICD-10-CM | POA: Diagnosis not present

## 2015-05-19 DIAGNOSIS — L03011 Cellulitis of right finger: Secondary | ICD-10-CM

## 2015-05-19 DIAGNOSIS — L02611 Cutaneous abscess of right foot: Secondary | ICD-10-CM | POA: Diagnosis not present

## 2015-05-19 MED ORDER — MUPIROCIN CALCIUM 2 % EX CREA: 1.0000 | TOPICAL_CREAM | Freq: Two times a day (BID) | CUTANEOUS | Status: DC

## 2015-05-19 NOTE — Progress Notes (Signed)
Patient ID: [name], male   DOB: 12/08/1940, 74 y.o.   MRN: [id number] Subjective: [name] is a 74 y.o.  male patient returns to office today for follow up evaluation after having Right 2nd toe medial permanent nail avulsion performed on (05/03/15). Patient has been soaking and applying betadine daily. Patient is concerned because increased redness and pain and swelling of the toe. Patient denies fever/chills/nausea/vomitting/any other related constitutional symptoms at this time.  Objective:  Vitals: Reviewed  General: Well developed, nourished, in no acute distress, alert and oriented x3   Dermatology: Skin is warm, dry and supple bilateral. Right 2nd medial nail border is covered with a fluctuant eschar, once debrided purelent drainage was noted. (+) Erythema/focal cellulitis. (+) Edema. (-)Malodor. The remaining nails appear unremarkable at this time. There are no other lesions or other signs of infection  present.  Vascular: Dorsalis Pedis artery and Posterior Tibial artery pedal pulses are 1/4 bilateral with  capillary fill time <3 secs. Scant Pedal hair growth present. No lower extremity edema.   Neruologic: Grossly intact via light touch bilateral.  Musculoskeletal: Tenderness to palpation of the Right 2nd toe medial nail fold. Muscular strength within normal limits.   Assesement and Plan: Problem List Items Addressed This Visit    None    Visit Diagnoses    Paronychia, right    -  Primary    3wks Status post matriexectomy (right 2nd toe tibial border)    Relevant Medications    mupirocin cream (BACTROBAN) 2 %    Other Relevant Orders    Wound culture    Cellulitis and abscess of toe, right        Relevant Medications    mupirocin cream (BACTROBAN) 2 %      -Examined patient  -Cleansed right 2nd toe medial nail fold with saline and gently debrided away eschar, wound culture of purelent drainage was performed; applied silvadene covered with 2x2 guaze and coban.   -Discussed plan of care with patient. -Rx Bactroban cream to apply to site -Cont. With Keflex until complete -Cont. Epsom salt and warm water. Patient was instructed to soak for 15-20 minutes each day until all drainage, redness, tenderness, or swelling is gone from the procedure site. -Educated patient on long term care after nail surgery and close monitoring in setting of infection; will call patient if need to change PO oral antibiotics base on culture results. -Patient was instrcuted to monitor the toe for worsening signs of infection.  -Patient is to return in 1 week or sooner if problems arise.  Landis Martins, DPM

## 2015-05-22 LAB — WOUND CULTURE

## 2015-05-24 ENCOUNTER — Ambulatory Visit: Payer: Medicare Other | Admitting: Podiatry

## 2015-05-24 ENCOUNTER — Ambulatory Visit (INDEPENDENT_AMBULATORY_CARE_PROVIDER_SITE_OTHER): Payer: Medicare Other | Admitting: Podiatry

## 2015-05-24 ENCOUNTER — Encounter: Payer: Self-pay | Admitting: Podiatry

## 2015-05-24 VITALS — BP 132/70 | HR 76 | Resp 18

## 2015-05-24 DIAGNOSIS — L02611 Cutaneous abscess of right foot: Secondary | ICD-10-CM

## 2015-05-24 DIAGNOSIS — L03031 Cellulitis of right toe: Secondary | ICD-10-CM

## 2015-05-24 NOTE — Progress Notes (Signed)
He presents today for follow-up of his infected matrixectomy second digit right foot. He states that seems to be doing better but is still bleeds. He's been taking his antibiotics and will take his last peel today.  Objective: Vital signs are stable alert and oriented 3. Second digit the right foot erythema is restricted to the DIPJ and distal medial aspect of the toe there is no purulence no malodor small margin of the skin appears to be necrotic and there is some blood on palpation of the area. It appears to be fresh blood more than likely secondary to the fact that he is on blood thinners. The pathology report did come back positive for probable staph or strep.  Assessment: Mild infection with cellulitis second digit right foot status post matrixectomy.  Plan: I would like to continue the Keflex since it seems to be working well for him he will also continue the Bactroban ointment. I will follow up with him next week to make sure that he is healing. May consider an x-ray to make sure that we have no bone involvement.

## 2015-05-29 DIAGNOSIS — I96 Gangrene, not elsewhere classified: Secondary | ICD-10-CM | POA: Diagnosis not present

## 2015-05-29 DIAGNOSIS — G894 Chronic pain syndrome: Secondary | ICD-10-CM | POA: Diagnosis not present

## 2015-05-29 DIAGNOSIS — L0390 Cellulitis, unspecified: Secondary | ICD-10-CM | POA: Diagnosis not present

## 2015-05-29 DIAGNOSIS — Z1389 Encounter for screening for other disorder: Secondary | ICD-10-CM | POA: Diagnosis not present

## 2015-05-29 DIAGNOSIS — Z7901 Long term (current) use of anticoagulants: Secondary | ICD-10-CM | POA: Diagnosis not present

## 2015-05-29 DIAGNOSIS — Z6825 Body mass index (BMI) 25.0-25.9, adult: Secondary | ICD-10-CM | POA: Diagnosis not present

## 2015-05-29 DIAGNOSIS — Z23 Encounter for immunization: Secondary | ICD-10-CM | POA: Diagnosis not present

## 2015-05-29 DIAGNOSIS — I4891 Unspecified atrial fibrillation: Secondary | ICD-10-CM | POA: Diagnosis not present

## 2015-05-31 ENCOUNTER — Ambulatory Visit: Payer: Medicare Other | Admitting: Podiatry

## 2015-06-07 ENCOUNTER — Other Ambulatory Visit (HOSPITAL_COMMUNITY): Payer: Self-pay | Admitting: Internal Medicine

## 2015-06-07 DIAGNOSIS — I96 Gangrene, not elsewhere classified: Secondary | ICD-10-CM

## 2015-06-09 ENCOUNTER — Other Ambulatory Visit (HOSPITAL_COMMUNITY): Payer: Self-pay | Admitting: Internal Medicine

## 2015-06-09 ENCOUNTER — Ambulatory Visit (HOSPITAL_COMMUNITY)
Admission: RE | Admit: 2015-06-09 | Discharge: 2015-06-09 | Disposition: A | Discharge status: Home / Self Care | Payer: Medicare Other | Source: Ambulatory Visit | Attending: Internal Medicine | Admitting: Internal Medicine

## 2015-06-09 DIAGNOSIS — I96 Gangrene, not elsewhere classified: Secondary | ICD-10-CM | POA: Diagnosis not present

## 2015-06-09 DIAGNOSIS — Z9889 Other specified postprocedural states: Secondary | ICD-10-CM | POA: Diagnosis not present

## 2015-06-26 DIAGNOSIS — M1991 Primary osteoarthritis, unspecified site: Secondary | ICD-10-CM | POA: Diagnosis not present

## 2015-06-26 DIAGNOSIS — I4891 Unspecified atrial fibrillation: Secondary | ICD-10-CM | POA: Diagnosis not present

## 2015-06-26 DIAGNOSIS — Z1389 Encounter for screening for other disorder: Secondary | ICD-10-CM | POA: Diagnosis not present

## 2015-06-26 DIAGNOSIS — N400 Enlarged prostate without lower urinary tract symptoms: Secondary | ICD-10-CM | POA: Diagnosis not present

## 2015-06-26 DIAGNOSIS — Z7901 Long term (current) use of anticoagulants: Secondary | ICD-10-CM | POA: Diagnosis not present

## 2015-06-26 DIAGNOSIS — Z6825 Body mass index (BMI) 25.0-25.9, adult: Secondary | ICD-10-CM | POA: Diagnosis not present

## 2015-06-26 DIAGNOSIS — G894 Chronic pain syndrome: Secondary | ICD-10-CM | POA: Diagnosis not present

## 2015-07-03 ENCOUNTER — Telehealth: Payer: Self-pay | Admitting: Cardiovascular Disease

## 2015-07-03 NOTE — Telephone Encounter (Signed)
Received records from [facility] for appointment on 07/12/15 with Dr Gwenlyn Found.  Records given to [facility] (medical records) for Dr Kennon Holter schedule on 07/12/15. lp

## 2015-07-12 ENCOUNTER — Encounter: Payer: Self-pay | Admitting: Cardiovascular Disease

## 2015-07-12 ENCOUNTER — Ambulatory Visit (INDEPENDENT_AMBULATORY_CARE_PROVIDER_SITE_OTHER): Payer: Medicare Other | Admitting: Cardiovascular Disease

## 2015-07-12 VITALS — BP 132/76 | HR 78 | Ht 65.0 in | Wt 155.0 lb

## 2015-07-12 DIAGNOSIS — I2583 Coronary atherosclerosis due to lipid rich plaque: Secondary | ICD-10-CM

## 2015-07-12 DIAGNOSIS — E785 Hyperlipidemia, unspecified: Secondary | ICD-10-CM

## 2015-07-12 DIAGNOSIS — I739 Peripheral vascular disease, unspecified: Secondary | ICD-10-CM | POA: Diagnosis not present

## 2015-07-12 DIAGNOSIS — I482 Chronic atrial fibrillation, unspecified: Secondary | ICD-10-CM

## 2015-07-12 DIAGNOSIS — I2510 Atherosclerotic heart disease of native coronary artery without angina pectoris: Secondary | ICD-10-CM

## 2015-07-12 DIAGNOSIS — I4891 Unspecified atrial fibrillation: Secondary | ICD-10-CM | POA: Insufficient documentation

## 2015-07-12 DIAGNOSIS — Z950 Presence of cardiac pacemaker: Secondary | ICD-10-CM | POA: Insufficient documentation

## 2015-07-12 DIAGNOSIS — Z0000 Encounter for general adult medical examination without abnormal findings: Secondary | ICD-10-CM

## 2015-07-12 DIAGNOSIS — I10 Essential (primary) hypertension: Secondary | ICD-10-CM

## 2015-07-12 NOTE — Assessment & Plan Note (Signed)
Mr. Greenwell was referred to be by Dr. Gerarda Fraction in [place] for peripheral vascular disease evaluation. He has a history of coronary artery disease status post bypass grafting in 2007. He has remote tobacco abuse, treated hypertension and hyperlipidemia. He had ingrown toenail that was treated by Dr. Scherrie November. He had Doppler studies performed at [facility] that showed non-calculable ABIs because of vascular noncompressibility biphasic waveforms. He denies claudication. He does have palpable pedal pulses. No further evaluation is required at this time.

## 2015-07-12 NOTE — Assessment & Plan Note (Signed)
History of hypertension blood pressure measured at 132/76. He is on lisinopril, metoprolol, and amlodipine. Continue current meds at current dosing

## 2015-07-12 NOTE — Assessment & Plan Note (Signed)
istory of chronic A. Fib on Coumadin and coagulation and sotalol. He has required DC cardioversion several times in the past and has a had a permanent pacemaker placed 3 years ago.

## 2015-07-12 NOTE — Assessment & Plan Note (Signed)
History of CAD status post coronary artery bypass grafting at [facility] in 2007

## 2015-07-12 NOTE — Assessment & Plan Note (Signed)
History of permanent transvenous pacemaker insertion 3 years ago followed by his primary cardiologist Dr. Wilburn Cornelia

## 2015-07-12 NOTE — Patient Instructions (Signed)
Medication Instructions:  Your physician recommends that you continue on your current medications as directed. Please refer to the Current Medication list given to you today.   Labwork: none  Testing/Procedures: none  Follow-Up: Follow up with Dr. Berry as needed.   Any Other Special Instructions Will Be Listed Below (If Applicable).     If you need a refill on your cardiac medications before your next appointment, please call your pharmacy.   

## 2015-07-12 NOTE — Progress Notes (Signed)
07/12/2015 Kathyrn Lass   February 05, 1941  [id number]  Primary Physician Glo Herring., MD Primary Cardiologist: Lorretta Harp MD Renae Gloss   HPI:  Mr. Laprise is a 74 year old married Caucasian male father of 87, grandfather of 8 grandchildren referred by Dr. Gerarda Fraction for peripheral vascular evaluation. He has a history of coronary artery disease status post bypass grafting at [facility] in 2007. He's had a permanent transvenous pacemaker placed 2-3 years ago. He has chronic A. Fib cardioverted several times on Coumadin anticoagulation and sotalol. Other problems include history of treated hypertension and hyperlipidemia. He's had an ingrown toenail instrumented by Dr. Scherrie November. Arterial Dopplers performed at [facility] 06/09/15 showed non-calculable ABIs because of noncompressibility and biphasic waveforms. The patient denies claudication.   Current Outpatient Prescriptions  Medication Sig Dispense Refill  . amLODipine (NORVASC) 10 MG tablet     . cephALEXin (KEFLEX) 500 MG capsule Take 1 capsule (500 mg total) by mouth 3 (three) times daily. 30 capsule 2  . lisinopril (PRINIVIL,ZESTRIL) 20 MG tablet     . metoprolol succinate (TOPROL-XL) 25 MG 24 hr tablet Take by mouth.    . mupirocin cream (BACTROBAN) 2 % Apply 1 application topically 2 (two) times daily. 15 g 0  . NEOMYCIN-POLYMYXIN-HYDROCORTISONE (CORTISPORIN) 1 % SOLN otic solution Apply 1-2 drops to toe BID after soaking 10 mL 1  . simvastatin (ZOCOR) 20 MG tablet Take by mouth.    . sotalol (BETAPACE) 120 MG tablet Take by mouth.    . warfarin (COUMADIN) 1 MG tablet Take 1 mg by mouth daily.    Marland Kitchen warfarin (COUMADIN) 5 MG tablet Take by mouth.     No current facility-administered medications for this visit.    No Known Allergies  Social History   Social History  . Marital Status: Married    Spouse Name: N/A  . Number of Children: N/A  . Years of Education: N/A   Occupational  History  . Not on file.   Social History Main Topics  . Smoking status: Never Smoker   . Smokeless tobacco: Never Used  . Alcohol Use: No  . Drug Use: No  . Sexual Activity: Not on file   Other Topics Concern  . Not on file   Social History Narrative     Review of Systems: General: negative for chills, fever, night sweats or weight changes.  Cardiovascular: negative for chest pain, dyspnea on exertion, edema, orthopnea, palpitations, paroxysmal nocturnal dyspnea or shortness of breath Dermatological: negative for rash Respiratory: negative for cough or wheezing Urologic: negative for hematuria Abdominal: negative for nausea, vomiting, diarrhea, bright red blood per rectum, melena, or hematemesis Neurologic: negative for visual changes, syncope, or dizziness All other systems reviewed and are otherwise negative except as noted above.    Blood pressure 132/76, pulse 78, height 5\' 5"  (1.651 m), weight 155 lb (70.308 kg).  General appearance: alert and no distress Neck: no adenopathy, no carotid bruit, no JVD, supple, symmetrical, trachea midline and thyroid not enlarged, symmetric, no tenderness/mass/nodules Lungs: clear to auscultation bilaterally Heart: regular rate and rhythm, S1, S2 normal, no murmur, click, rub or gallop Extremities: extremities normal, atraumatic, no cyanosis or edema  EKG ventricular paced rhythm. I personally reviewed this EKG  ASSESSMENT AND PLAN:   Peripheral arterial disease [facility]) Mr. M[place]elburg was referred to be by Dr. Gerarda Fraction in [place] for peripheral vascular disease evaluation. He has a history of coronary artery disease status post bypass grafting in  2007. He has remote tobacco abuse, treated hypertension and hyperlipidemia. He had ingrown toenail that was treated by Dr. Scherrie November. He had Doppler studies performed at [facility] that showed non-calculable ABIs because of vascular noncompressibility biphasic waveforms. He denies claudication. He  does have palpable pedal pulses. No further evaluation is required at this time.  Hyperlipidemia History of hyperlipidemia on statin therapy followed by his PCP  Essential hypertension History of hypertension blood pressure measured at 132/76. He is on lisinopril, metoprolol, and amlodipine. Continue current meds at current dosing  Coronary artery disease due to lipid rich plaque History of CAD status post coronary artery bypass grafting at [facility] in 2007  Cardiac pacemaker in situ History of permanent transvenous pacemaker insertion 3 years ago followed by his primary cardiologist Dr. Wilburn Cornelia  Atrial fibrillation [facility]) istory of chronic A. Fib on Coumadin and coagulation and sotalol. He has required DC cardioversion several times in the past and has a had a permanent pacemaker placed 3 years ago.      Lorretta Harp MD FACP,FACC,FAHA, [facility] 07/12/2015 1:57 PM

## 2015-07-12 NOTE — Assessment & Plan Note (Signed)
History of hyperlipidemia on statin therapy followed by his PCP 

## 2015-07-21 DIAGNOSIS — Z1389 Encounter for screening for other disorder: Secondary | ICD-10-CM | POA: Diagnosis not present

## 2015-07-21 DIAGNOSIS — Z6826 Body mass index (BMI) 26.0-26.9, adult: Secondary | ICD-10-CM | POA: Diagnosis not present

## 2015-07-21 DIAGNOSIS — M1991 Primary osteoarthritis, unspecified site: Secondary | ICD-10-CM | POA: Diagnosis not present

## 2015-07-21 DIAGNOSIS — Z7901 Long term (current) use of anticoagulants: Secondary | ICD-10-CM | POA: Diagnosis not present

## 2015-07-21 DIAGNOSIS — I4891 Unspecified atrial fibrillation: Secondary | ICD-10-CM | POA: Diagnosis not present

## 2015-07-21 DIAGNOSIS — G894 Chronic pain syndrome: Secondary | ICD-10-CM | POA: Diagnosis not present

## 2015-08-21 DIAGNOSIS — Z6826 Body mass index (BMI) 26.0-26.9, adult: Secondary | ICD-10-CM | POA: Diagnosis not present

## 2015-08-21 DIAGNOSIS — F1120 Opioid dependence, uncomplicated: Secondary | ICD-10-CM | POA: Diagnosis not present

## 2015-08-21 DIAGNOSIS — E782 Mixed hyperlipidemia: Secondary | ICD-10-CM | POA: Diagnosis not present

## 2015-08-21 DIAGNOSIS — G894 Chronic pain syndrome: Secondary | ICD-10-CM | POA: Diagnosis not present

## 2015-08-21 DIAGNOSIS — M1991 Primary osteoarthritis, unspecified site: Secondary | ICD-10-CM | POA: Diagnosis not present

## 2015-08-21 DIAGNOSIS — E663 Overweight: Secondary | ICD-10-CM | POA: Diagnosis not present

## 2015-08-21 DIAGNOSIS — I4891 Unspecified atrial fibrillation: Secondary | ICD-10-CM | POA: Diagnosis not present

## 2015-08-21 DIAGNOSIS — E039 Hypothyroidism, unspecified: Secondary | ICD-10-CM | POA: Diagnosis not present

## 2015-08-21 DIAGNOSIS — N400 Enlarged prostate without lower urinary tract symptoms: Secondary | ICD-10-CM | POA: Diagnosis not present

## 2015-08-21 DIAGNOSIS — Z7901 Long term (current) use of anticoagulants: Secondary | ICD-10-CM | POA: Diagnosis not present

## 2015-08-21 DIAGNOSIS — Z1389 Encounter for screening for other disorder: Secondary | ICD-10-CM | POA: Diagnosis not present

## 2015-08-21 DIAGNOSIS — I739 Peripheral vascular disease, unspecified: Secondary | ICD-10-CM | POA: Diagnosis not present

## 2015-09-19 DIAGNOSIS — I482 Chronic atrial fibrillation: Secondary | ICD-10-CM | POA: Diagnosis not present

## 2015-09-22 DIAGNOSIS — Z7901 Long term (current) use of anticoagulants: Secondary | ICD-10-CM | POA: Diagnosis not present

## 2015-09-22 DIAGNOSIS — Z6826 Body mass index (BMI) 26.0-26.9, adult: Secondary | ICD-10-CM | POA: Diagnosis not present

## 2015-09-22 DIAGNOSIS — M1991 Primary osteoarthritis, unspecified site: Secondary | ICD-10-CM | POA: Diagnosis not present

## 2015-09-22 DIAGNOSIS — I10 Essential (primary) hypertension: Secondary | ICD-10-CM | POA: Diagnosis not present

## 2015-09-22 DIAGNOSIS — G894 Chronic pain syndrome: Secondary | ICD-10-CM | POA: Diagnosis not present

## 2015-09-22 DIAGNOSIS — Z0000 Encounter for general adult medical examination without abnormal findings: Secondary | ICD-10-CM | POA: Diagnosis not present

## 2015-09-25 DIAGNOSIS — H43811 Vitreous degeneration, right eye: Secondary | ICD-10-CM | POA: Diagnosis not present

## 2015-10-13 DIAGNOSIS — Z08 Encounter for follow-up examination after completed treatment for malignant neoplasm: Secondary | ICD-10-CM | POA: Diagnosis not present

## 2015-10-13 DIAGNOSIS — D485 Neoplasm of uncertain behavior of skin: Secondary | ICD-10-CM | POA: Diagnosis not present

## 2015-10-13 DIAGNOSIS — Z85828 Personal history of other malignant neoplasm of skin: Secondary | ICD-10-CM | POA: Diagnosis not present

## 2015-10-13 DIAGNOSIS — C44319 Basal cell carcinoma of skin of other parts of face: Secondary | ICD-10-CM | POA: Diagnosis not present

## 2015-10-18 DIAGNOSIS — R0602 Shortness of breath: Secondary | ICD-10-CM | POA: Diagnosis not present

## 2015-10-18 DIAGNOSIS — I480 Paroxysmal atrial fibrillation: Secondary | ICD-10-CM | POA: Diagnosis not present

## 2015-10-18 DIAGNOSIS — J449 Chronic obstructive pulmonary disease, unspecified: Secondary | ICD-10-CM | POA: Diagnosis not present

## 2015-10-18 DIAGNOSIS — I739 Peripheral vascular disease, unspecified: Secondary | ICD-10-CM | POA: Diagnosis not present

## 2015-10-23 DIAGNOSIS — G8929 Other chronic pain: Secondary | ICD-10-CM | POA: Diagnosis not present

## 2015-10-23 DIAGNOSIS — Z6826 Body mass index (BMI) 26.0-26.9, adult: Secondary | ICD-10-CM | POA: Diagnosis not present

## 2015-10-23 DIAGNOSIS — M150 Primary generalized (osteo)arthritis: Secondary | ICD-10-CM | POA: Diagnosis not present

## 2015-10-23 DIAGNOSIS — I10 Essential (primary) hypertension: Secondary | ICD-10-CM | POA: Diagnosis not present

## 2015-10-23 DIAGNOSIS — Z7901 Long term (current) use of anticoagulants: Secondary | ICD-10-CM | POA: Diagnosis not present

## 2015-10-23 DIAGNOSIS — Z1389 Encounter for screening for other disorder: Secondary | ICD-10-CM | POA: Diagnosis not present

## 2015-10-27 DIAGNOSIS — E781 Pure hyperglyceridemia: Secondary | ICD-10-CM | POA: Diagnosis not present

## 2015-10-27 DIAGNOSIS — I10 Essential (primary) hypertension: Secondary | ICD-10-CM | POA: Diagnosis not present

## 2015-10-27 DIAGNOSIS — E039 Hypothyroidism, unspecified: Secondary | ICD-10-CM | POA: Diagnosis not present

## 2015-11-01 DIAGNOSIS — E119 Type 2 diabetes mellitus without complications: Secondary | ICD-10-CM | POA: Diagnosis not present

## 2015-11-01 DIAGNOSIS — H04123 Dry eye syndrome of bilateral lacrimal glands: Secondary | ICD-10-CM | POA: Diagnosis not present

## 2015-11-01 DIAGNOSIS — H43813 Vitreous degeneration, bilateral: Secondary | ICD-10-CM | POA: Diagnosis not present

## 2015-11-01 DIAGNOSIS — H52203 Unspecified astigmatism, bilateral: Secondary | ICD-10-CM | POA: Diagnosis not present

## 2015-11-09 DIAGNOSIS — Z0100 Encounter for examination of eyes and vision without abnormal findings: Secondary | ICD-10-CM | POA: Diagnosis not present

## 2015-11-15 DIAGNOSIS — J449 Chronic obstructive pulmonary disease, unspecified: Secondary | ICD-10-CM | POA: Diagnosis not present

## 2015-11-15 DIAGNOSIS — I480 Paroxysmal atrial fibrillation: Secondary | ICD-10-CM | POA: Diagnosis not present

## 2015-11-15 DIAGNOSIS — I482 Chronic atrial fibrillation: Secondary | ICD-10-CM | POA: Diagnosis not present

## 2015-11-15 DIAGNOSIS — I2510 Atherosclerotic heart disease of native coronary artery without angina pectoris: Secondary | ICD-10-CM | POA: Diagnosis not present

## 2015-11-15 DIAGNOSIS — I10 Essential (primary) hypertension: Secondary | ICD-10-CM | POA: Diagnosis not present

## 2015-11-15 DIAGNOSIS — G459 Transient cerebral ischemic attack, unspecified: Secondary | ICD-10-CM | POA: Diagnosis not present

## 2015-11-15 DIAGNOSIS — R0602 Shortness of breath: Secondary | ICD-10-CM | POA: Diagnosis not present

## 2015-11-15 DIAGNOSIS — I495 Sick sinus syndrome: Secondary | ICD-10-CM | POA: Diagnosis not present

## 2015-11-15 DIAGNOSIS — E784 Other hyperlipidemia: Secondary | ICD-10-CM | POA: Diagnosis not present

## 2015-11-17 DIAGNOSIS — Z7901 Long term (current) use of anticoagulants: Secondary | ICD-10-CM | POA: Diagnosis not present

## 2015-11-17 DIAGNOSIS — M1991 Primary osteoarthritis, unspecified site: Secondary | ICD-10-CM | POA: Diagnosis not present

## 2015-11-17 DIAGNOSIS — Z6826 Body mass index (BMI) 26.0-26.9, adult: Secondary | ICD-10-CM | POA: Diagnosis not present

## 2015-11-17 DIAGNOSIS — Z1389 Encounter for screening for other disorder: Secondary | ICD-10-CM | POA: Diagnosis not present

## 2015-11-17 DIAGNOSIS — G894 Chronic pain syndrome: Secondary | ICD-10-CM | POA: Diagnosis not present

## 2015-11-17 DIAGNOSIS — H6090 Unspecified otitis externa, unspecified ear: Secondary | ICD-10-CM | POA: Diagnosis not present

## 2015-11-20 DIAGNOSIS — E039 Hypothyroidism, unspecified: Secondary | ICD-10-CM | POA: Diagnosis not present

## 2015-11-20 DIAGNOSIS — Z1389 Encounter for screening for other disorder: Secondary | ICD-10-CM | POA: Diagnosis not present

## 2015-12-15 DIAGNOSIS — Z6826 Body mass index (BMI) 26.0-26.9, adult: Secondary | ICD-10-CM | POA: Diagnosis not present

## 2015-12-15 DIAGNOSIS — G894 Chronic pain syndrome: Secondary | ICD-10-CM | POA: Diagnosis not present

## 2015-12-15 DIAGNOSIS — E063 Autoimmune thyroiditis: Secondary | ICD-10-CM | POA: Diagnosis not present

## 2015-12-15 DIAGNOSIS — I10 Essential (primary) hypertension: Secondary | ICD-10-CM | POA: Diagnosis not present

## 2015-12-15 DIAGNOSIS — E663 Overweight: Secondary | ICD-10-CM | POA: Diagnosis not present

## 2015-12-15 DIAGNOSIS — F1120 Opioid dependence, uncomplicated: Secondary | ICD-10-CM | POA: Diagnosis not present

## 2015-12-15 DIAGNOSIS — Z7901 Long term (current) use of anticoagulants: Secondary | ICD-10-CM | POA: Diagnosis not present

## 2015-12-15 DIAGNOSIS — M1991 Primary osteoarthritis, unspecified site: Secondary | ICD-10-CM | POA: Diagnosis not present

## 2015-12-25 DIAGNOSIS — L255 Unspecified contact dermatitis due to plants, except food: Secondary | ICD-10-CM | POA: Diagnosis not present

## 2015-12-25 DIAGNOSIS — L299 Pruritus, unspecified: Secondary | ICD-10-CM | POA: Diagnosis not present

## 2015-12-25 DIAGNOSIS — Z6826 Body mass index (BMI) 26.0-26.9, adult: Secondary | ICD-10-CM | POA: Diagnosis not present

## 2015-12-25 DIAGNOSIS — E663 Overweight: Secondary | ICD-10-CM | POA: Diagnosis not present

## 2016-01-16 DIAGNOSIS — M1991 Primary osteoarthritis, unspecified site: Secondary | ICD-10-CM | POA: Diagnosis not present

## 2016-01-16 DIAGNOSIS — E063 Autoimmune thyroiditis: Secondary | ICD-10-CM | POA: Diagnosis not present

## 2016-01-16 DIAGNOSIS — I4891 Unspecified atrial fibrillation: Secondary | ICD-10-CM | POA: Diagnosis not present

## 2016-01-16 DIAGNOSIS — Z7901 Long term (current) use of anticoagulants: Secondary | ICD-10-CM | POA: Diagnosis not present

## 2016-01-16 DIAGNOSIS — G894 Chronic pain syndrome: Secondary | ICD-10-CM | POA: Diagnosis not present

## 2016-01-16 DIAGNOSIS — Z6825 Body mass index (BMI) 25.0-25.9, adult: Secondary | ICD-10-CM | POA: Diagnosis not present

## 2016-02-14 DIAGNOSIS — G894 Chronic pain syndrome: Secondary | ICD-10-CM | POA: Diagnosis not present

## 2016-02-14 DIAGNOSIS — Z6824 Body mass index (BMI) 24.0-24.9, adult: Secondary | ICD-10-CM | POA: Diagnosis not present

## 2016-02-14 DIAGNOSIS — G9001 Carotid sinus syncope: Secondary | ICD-10-CM | POA: Diagnosis not present

## 2016-02-14 DIAGNOSIS — Z7901 Long term (current) use of anticoagulants: Secondary | ICD-10-CM | POA: Diagnosis not present

## 2016-02-14 DIAGNOSIS — E063 Autoimmune thyroiditis: Secondary | ICD-10-CM | POA: Diagnosis not present

## 2016-02-14 DIAGNOSIS — F1120 Opioid dependence, uncomplicated: Secondary | ICD-10-CM | POA: Diagnosis not present

## 2016-03-20 DIAGNOSIS — Z7901 Long term (current) use of anticoagulants: Secondary | ICD-10-CM | POA: Diagnosis not present

## 2016-03-20 DIAGNOSIS — G894 Chronic pain syndrome: Secondary | ICD-10-CM | POA: Diagnosis not present

## 2016-03-20 DIAGNOSIS — M1991 Primary osteoarthritis, unspecified site: Secondary | ICD-10-CM | POA: Diagnosis not present

## 2016-03-20 DIAGNOSIS — Z6826 Body mass index (BMI) 26.0-26.9, adult: Secondary | ICD-10-CM | POA: Diagnosis not present

## 2016-03-26 DIAGNOSIS — I482 Chronic atrial fibrillation: Secondary | ICD-10-CM | POA: Diagnosis not present

## 2016-04-19 DIAGNOSIS — Z7901 Long term (current) use of anticoagulants: Secondary | ICD-10-CM | POA: Diagnosis not present

## 2016-04-19 DIAGNOSIS — I10 Essential (primary) hypertension: Secondary | ICD-10-CM | POA: Diagnosis not present

## 2016-04-19 DIAGNOSIS — Z6826 Body mass index (BMI) 26.0-26.9, adult: Secondary | ICD-10-CM | POA: Diagnosis not present

## 2016-04-19 DIAGNOSIS — G894 Chronic pain syndrome: Secondary | ICD-10-CM | POA: Diagnosis not present

## 2016-05-17 DIAGNOSIS — Z7901 Long term (current) use of anticoagulants: Secondary | ICD-10-CM | POA: Diagnosis not present

## 2016-05-22 DIAGNOSIS — G459 Transient cerebral ischemic attack, unspecified: Secondary | ICD-10-CM | POA: Diagnosis not present

## 2016-05-22 DIAGNOSIS — I495 Sick sinus syndrome: Secondary | ICD-10-CM | POA: Diagnosis not present

## 2016-05-22 DIAGNOSIS — I480 Paroxysmal atrial fibrillation: Secondary | ICD-10-CM | POA: Diagnosis not present

## 2016-05-22 DIAGNOSIS — I2510 Atherosclerotic heart disease of native coronary artery without angina pectoris: Secondary | ICD-10-CM | POA: Diagnosis not present

## 2016-05-22 DIAGNOSIS — E784 Other hyperlipidemia: Secondary | ICD-10-CM | POA: Diagnosis not present

## 2016-05-22 DIAGNOSIS — I482 Chronic atrial fibrillation: Secondary | ICD-10-CM | POA: Diagnosis not present

## 2016-05-22 DIAGNOSIS — I10 Essential (primary) hypertension: Secondary | ICD-10-CM | POA: Diagnosis not present

## 2016-05-22 DIAGNOSIS — J449 Chronic obstructive pulmonary disease, unspecified: Secondary | ICD-10-CM | POA: Diagnosis not present

## 2016-05-22 DIAGNOSIS — R0602 Shortness of breath: Secondary | ICD-10-CM | POA: Diagnosis not present

## 2016-05-30 DIAGNOSIS — M1991 Primary osteoarthritis, unspecified site: Secondary | ICD-10-CM | POA: Diagnosis not present

## 2016-05-30 DIAGNOSIS — Z1389 Encounter for screening for other disorder: Secondary | ICD-10-CM | POA: Diagnosis not present

## 2016-05-30 DIAGNOSIS — G894 Chronic pain syndrome: Secondary | ICD-10-CM | POA: Diagnosis not present

## 2016-05-30 DIAGNOSIS — E063 Autoimmune thyroiditis: Secondary | ICD-10-CM | POA: Diagnosis not present

## 2016-05-30 DIAGNOSIS — Z6825 Body mass index (BMI) 25.0-25.9, adult: Secondary | ICD-10-CM | POA: Diagnosis not present

## 2016-06-06 DIAGNOSIS — H26491 Other secondary cataract, right eye: Secondary | ICD-10-CM | POA: Diagnosis not present

## 2016-06-13 DIAGNOSIS — H26491 Other secondary cataract, right eye: Secondary | ICD-10-CM | POA: Diagnosis not present

## 2016-06-17 DIAGNOSIS — Z7901 Long term (current) use of anticoagulants: Secondary | ICD-10-CM | POA: Diagnosis not present

## 2016-07-22 DIAGNOSIS — E663 Overweight: Secondary | ICD-10-CM | POA: Diagnosis not present

## 2016-07-22 DIAGNOSIS — G894 Chronic pain syndrome: Secondary | ICD-10-CM | POA: Diagnosis not present

## 2016-07-22 DIAGNOSIS — Z7901 Long term (current) use of anticoagulants: Secondary | ICD-10-CM | POA: Diagnosis not present

## 2016-07-22 DIAGNOSIS — Z6826 Body mass index (BMI) 26.0-26.9, adult: Secondary | ICD-10-CM | POA: Diagnosis not present

## 2016-07-22 DIAGNOSIS — Z1389 Encounter for screening for other disorder: Secondary | ICD-10-CM | POA: Diagnosis not present

## 2016-07-22 DIAGNOSIS — M2550 Pain in unspecified joint: Secondary | ICD-10-CM | POA: Diagnosis not present

## 2016-08-20 DIAGNOSIS — Z7901 Long term (current) use of anticoagulants: Secondary | ICD-10-CM | POA: Diagnosis not present

## 2016-09-18 DIAGNOSIS — Z7901 Long term (current) use of anticoagulants: Secondary | ICD-10-CM | POA: Diagnosis not present

## 2016-09-23 DIAGNOSIS — E663 Overweight: Secondary | ICD-10-CM | POA: Diagnosis not present

## 2016-09-23 DIAGNOSIS — I4891 Unspecified atrial fibrillation: Secondary | ICD-10-CM | POA: Diagnosis not present

## 2016-09-23 DIAGNOSIS — Z6827 Body mass index (BMI) 27.0-27.9, adult: Secondary | ICD-10-CM | POA: Diagnosis not present

## 2016-09-23 DIAGNOSIS — I2510 Atherosclerotic heart disease of native coronary artery without angina pectoris: Secondary | ICD-10-CM | POA: Diagnosis not present

## 2016-09-23 DIAGNOSIS — E063 Autoimmune thyroiditis: Secondary | ICD-10-CM | POA: Diagnosis not present

## 2016-09-23 DIAGNOSIS — E784 Other hyperlipidemia: Secondary | ICD-10-CM | POA: Diagnosis not present

## 2016-09-23 DIAGNOSIS — Z1389 Encounter for screening for other disorder: Secondary | ICD-10-CM | POA: Diagnosis not present

## 2016-09-23 DIAGNOSIS — N189 Chronic kidney disease, unspecified: Secondary | ICD-10-CM | POA: Diagnosis not present

## 2016-09-23 DIAGNOSIS — I482 Chronic atrial fibrillation: Secondary | ICD-10-CM | POA: Diagnosis not present

## 2016-09-23 DIAGNOSIS — Z0000 Encounter for general adult medical examination without abnormal findings: Secondary | ICD-10-CM | POA: Diagnosis not present

## 2016-09-23 DIAGNOSIS — I10 Essential (primary) hypertension: Secondary | ICD-10-CM | POA: Diagnosis not present

## 2016-10-18 DIAGNOSIS — I10 Essential (primary) hypertension: Secondary | ICD-10-CM | POA: Diagnosis not present

## 2016-10-18 DIAGNOSIS — E063 Autoimmune thyroiditis: Secondary | ICD-10-CM | POA: Diagnosis not present

## 2016-10-18 DIAGNOSIS — G894 Chronic pain syndrome: Secondary | ICD-10-CM | POA: Diagnosis not present

## 2016-10-18 DIAGNOSIS — Z7901 Long term (current) use of anticoagulants: Secondary | ICD-10-CM | POA: Diagnosis not present

## 2016-10-18 DIAGNOSIS — Z6826 Body mass index (BMI) 26.0-26.9, adult: Secondary | ICD-10-CM | POA: Diagnosis not present

## 2016-10-18 DIAGNOSIS — M1991 Primary osteoarthritis, unspecified site: Secondary | ICD-10-CM | POA: Diagnosis not present

## 2016-11-13 DIAGNOSIS — L600 Ingrowing nail: Secondary | ICD-10-CM | POA: Diagnosis not present

## 2016-11-13 DIAGNOSIS — M67471 Ganglion, right ankle and foot: Secondary | ICD-10-CM | POA: Diagnosis not present

## 2016-11-18 DIAGNOSIS — Z7901 Long term (current) use of anticoagulants: Secondary | ICD-10-CM | POA: Diagnosis not present

## 2016-11-22 DIAGNOSIS — L03031 Cellulitis of right toe: Secondary | ICD-10-CM | POA: Diagnosis not present

## 2016-12-10 DIAGNOSIS — I739 Peripheral vascular disease, unspecified: Secondary | ICD-10-CM | POA: Diagnosis not present

## 2016-12-10 DIAGNOSIS — E1151 Type 2 diabetes mellitus with diabetic peripheral angiopathy without gangrene: Secondary | ICD-10-CM | POA: Diagnosis not present

## 2016-12-12 DIAGNOSIS — Z7901 Long term (current) use of anticoagulants: Secondary | ICD-10-CM | POA: Diagnosis not present

## 2017-01-10 DIAGNOSIS — Z6826 Body mass index (BMI) 26.0-26.9, adult: Secondary | ICD-10-CM | POA: Diagnosis not present

## 2017-01-10 DIAGNOSIS — L03011 Cellulitis of right finger: Secondary | ICD-10-CM | POA: Diagnosis not present

## 2017-01-10 DIAGNOSIS — E663 Overweight: Secondary | ICD-10-CM | POA: Diagnosis not present

## 2017-01-10 DIAGNOSIS — Z1389 Encounter for screening for other disorder: Secondary | ICD-10-CM | POA: Diagnosis not present

## 2017-01-17 DIAGNOSIS — M1991 Primary osteoarthritis, unspecified site: Secondary | ICD-10-CM | POA: Diagnosis not present

## 2017-01-17 DIAGNOSIS — Z6825 Body mass index (BMI) 25.0-25.9, adult: Secondary | ICD-10-CM | POA: Diagnosis not present

## 2017-01-17 DIAGNOSIS — W64XXXD Exposure to other animate mechanical forces, subsequent encounter: Secondary | ICD-10-CM | POA: Diagnosis not present

## 2017-01-17 DIAGNOSIS — G894 Chronic pain syndrome: Secondary | ICD-10-CM | POA: Diagnosis not present

## 2017-01-17 DIAGNOSIS — Z7901 Long term (current) use of anticoagulants: Secondary | ICD-10-CM | POA: Diagnosis not present

## 2017-02-13 DIAGNOSIS — Z7901 Long term (current) use of anticoagulants: Secondary | ICD-10-CM | POA: Diagnosis not present

## 2017-03-14 DIAGNOSIS — Z7901 Long term (current) use of anticoagulants: Secondary | ICD-10-CM | POA: Diagnosis not present

## 2017-03-25 DIAGNOSIS — I481 Persistent atrial fibrillation: Secondary | ICD-10-CM | POA: Diagnosis not present

## 2017-04-10 DIAGNOSIS — Z6825 Body mass index (BMI) 25.0-25.9, adult: Secondary | ICD-10-CM | POA: Diagnosis not present

## 2017-04-10 DIAGNOSIS — G894 Chronic pain syndrome: Secondary | ICD-10-CM | POA: Diagnosis not present

## 2017-04-10 DIAGNOSIS — Z7901 Long term (current) use of anticoagulants: Secondary | ICD-10-CM | POA: Diagnosis not present

## 2017-04-10 DIAGNOSIS — E119 Type 2 diabetes mellitus without complications: Secondary | ICD-10-CM | POA: Diagnosis not present

## 2017-04-10 DIAGNOSIS — I4891 Unspecified atrial fibrillation: Secondary | ICD-10-CM | POA: Diagnosis not present

## 2017-05-09 DIAGNOSIS — Z7901 Long term (current) use of anticoagulants: Secondary | ICD-10-CM | POA: Diagnosis not present

## 2017-05-16 DIAGNOSIS — M19031 Primary osteoarthritis, right wrist: Secondary | ICD-10-CM | POA: Diagnosis not present

## 2017-05-16 DIAGNOSIS — Z6825 Body mass index (BMI) 25.0-25.9, adult: Secondary | ICD-10-CM | POA: Diagnosis not present

## 2017-05-16 DIAGNOSIS — E663 Overweight: Secondary | ICD-10-CM | POA: Diagnosis not present

## 2017-05-16 DIAGNOSIS — Z23 Encounter for immunization: Secondary | ICD-10-CM | POA: Diagnosis not present

## 2017-05-19 DIAGNOSIS — Z1211 Encounter for screening for malignant neoplasm of colon: Secondary | ICD-10-CM | POA: Diagnosis not present

## 2017-05-20 DIAGNOSIS — M13849 Other specified arthritis, unspecified hand: Secondary | ICD-10-CM | POA: Diagnosis not present

## 2017-05-22 DIAGNOSIS — M19049 Primary osteoarthritis, unspecified hand: Secondary | ICD-10-CM | POA: Diagnosis not present

## 2017-05-22 DIAGNOSIS — M19041 Primary osteoarthritis, right hand: Secondary | ICD-10-CM | POA: Diagnosis not present

## 2017-06-06 DIAGNOSIS — Z7901 Long term (current) use of anticoagulants: Secondary | ICD-10-CM | POA: Diagnosis not present

## 2017-06-16 DIAGNOSIS — H43813 Vitreous degeneration, bilateral: Secondary | ICD-10-CM | POA: Diagnosis not present

## 2017-07-14 DIAGNOSIS — M2550 Pain in unspecified joint: Secondary | ICD-10-CM | POA: Diagnosis not present

## 2017-07-14 DIAGNOSIS — Z1389 Encounter for screening for other disorder: Secondary | ICD-10-CM | POA: Diagnosis not present

## 2017-07-14 DIAGNOSIS — E663 Overweight: Secondary | ICD-10-CM | POA: Diagnosis not present

## 2017-07-14 DIAGNOSIS — Z6825 Body mass index (BMI) 25.0-25.9, adult: Secondary | ICD-10-CM | POA: Diagnosis not present

## 2017-07-14 DIAGNOSIS — I4891 Unspecified atrial fibrillation: Secondary | ICD-10-CM | POA: Diagnosis not present

## 2017-07-14 DIAGNOSIS — I10 Essential (primary) hypertension: Secondary | ICD-10-CM | POA: Diagnosis not present

## 2017-07-14 DIAGNOSIS — G894 Chronic pain syndrome: Secondary | ICD-10-CM | POA: Diagnosis not present

## 2017-07-14 DIAGNOSIS — Z7901 Long term (current) use of anticoagulants: Secondary | ICD-10-CM | POA: Diagnosis not present

## 2017-08-13 DIAGNOSIS — Z7901 Long term (current) use of anticoagulants: Secondary | ICD-10-CM | POA: Diagnosis not present

## 2017-09-10 DIAGNOSIS — Z7901 Long term (current) use of anticoagulants: Secondary | ICD-10-CM | POA: Diagnosis not present

## 2017-09-18 ENCOUNTER — Ambulatory Visit (INDEPENDENT_AMBULATORY_CARE_PROVIDER_SITE_OTHER): Payer: Medicare HMO | Admitting: Vascular Surgery

## 2017-09-18 ENCOUNTER — Encounter (INDEPENDENT_AMBULATORY_CARE_PROVIDER_SITE_OTHER): Payer: Self-pay | Admitting: Vascular Surgery

## 2017-09-18 VITALS — BP 127/76 | HR 71 | Resp 14 | Ht 65.0 in | Wt 154.0 lb

## 2017-09-18 DIAGNOSIS — E785 Hyperlipidemia, unspecified: Secondary | ICD-10-CM

## 2017-09-18 DIAGNOSIS — I739 Peripheral vascular disease, unspecified: Secondary | ICD-10-CM | POA: Diagnosis not present

## 2017-09-18 DIAGNOSIS — I83812 Varicose veins of left lower extremities with pain: Secondary | ICD-10-CM

## 2017-09-18 NOTE — Progress Notes (Signed)
Subjective:    Patient ID: [name], male    DOB: 1940/08/14, 77 y.o.   MRN: [id number] Chief Complaint  Patient presents with  . New Patient (Initial Visit)    Varicose Veins   Presents as a new patient self-referred for evaluation of varicosities located to the left calf.  The patient reports a remote history of breaking the bones in his left legs "years ago".  The patient notes that over the last few years the pain along the varicosities located to the back of his calf have increased.  The patient notes the discomfort is worse towards the end of the day.  The patient denies any claudication-like symptoms, rest pain or ulceration to the lower extremity.  The patient denies any DVT history to the lower extremity.  At this time, the patient does not engage in conservative therapy including wearing medical grade 1 compression stockings or elevating his legs on a daily basis.  The patient denies any erythema to the lower extremity.  The patient denies any fever, nausea vomiting.   Review of Systems  Constitutional: Negative.   HENT: Negative.   Eyes: Negative.   Respiratory: Negative.   Cardiovascular:       Painful varicose veins located to the left lower extremity  Gastrointestinal: Negative.   Endocrine: Negative.   Genitourinary: Negative.   Musculoskeletal: Negative.   Skin: Negative.   Allergic/Immunologic: Negative.   Neurological: Negative.   Hematological: Negative.   Psychiatric/Behavioral: Negative.       Objective:   Physical Exam  Constitutional: He is oriented to person, place, and time. He appears well-developed and well-nourished. No distress.  HENT:  Head: Normocephalic and atraumatic.  Eyes: Conjunctivae are normal. Pupils are equal, round, and reactive to light.  Neck: Normal range of motion.  Cardiovascular: Normal rate, regular rhythm, normal heart sounds and intact distal pulses.  Pulses:      Radial pulses are 2+ on the right side, and 2+ on the  left side.       Dorsalis pedis pulses are 1+ on the right side, and 1+ on the left side.       Posterior tibial pulses are 1+ on the right side, and 1+ on the left side.  Pulmonary/Chest: Effort normal and breath sounds normal.  Musculoskeletal: Normal range of motion. He exhibits no edema.  Neurological: He is alert and oriented to person, place, and time.  Skin: He is not diaphoretic.  Greater than 1 cm varicosities noted to the back of the left calf. Scattered less than 1 cm varicosities noted to the bilateral lower extremity There is moderate stasis dermatitis noted to the medial ankle and foot. There is no skin changes noted to the bilateral lower extremity.  There is no cellulitis.  Skin is intact.  Psychiatric: He has a normal mood and affect. His behavior is normal. Judgment and thought content normal.  Vitals reviewed.  BP 127/76 (BP Location: Right Arm, Patient Position: Sitting)   Pulse 71   Resp 14   Ht 5\' 5"  (1.651 m)   Wt 154 lb (69.9 kg)   BMI 25.63 kg/m   Past Medical History:  Diagnosis Date  . Atrial fibrillation ([place])   . Cardiac pacemaker in situ   . Coronary artery disease due to lipid rich plaque    history of coronary artery bypass grafting at [facility] 2007  . Hyperlipidemia   . Hypertension    Social History   Socioeconomic History  .  Marital status: Married    Spouse name: Not on file  . Number of children: Not on file  . Years of education: Not on file  . Highest education level: Not on file  Social Needs  . Financial resource strain: Not on file  . Food insecurity - worry: Not on file  . Food insecurity - inability: Not on file  . Transportation needs - medical: Not on file  . Transportation needs - non-medical: Not on file  Occupational History  . Not on file  Tobacco Use  . Smoking status: Never Smoker  . Smokeless tobacco: Never Used  Substance and Sexual Activity  . Alcohol use: No  . Drug use: No  . Sexual  activity: Not on file  Other Topics Concern  . Not on file  Social History Narrative  . Not on file   Past Surgical History:  Procedure Laterality Date  . BACK SURGERY    . HIP FRACTURE SURGERY    . open heart surgery    . 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 Heart failure Mother   . Heart disease Mother   . Cancer Father    No Known Allergies     Assessment & Plan:  Presents as a new patient self-referred for evaluation of varicosities located to the left calf.  The patient reports a remote history of breaking the bones in his left legs "years ago".  The patient notes that over the last few years the pain along the varicosities located to the back of his calf have increased.  The patient notes the discomfort is worse towards the end of the day.  The patient denies any claudication-like symptoms, rest pain or ulceration to the lower extremity.  The patient denies any DVT history to the lower extremity.  At this time, the patient does not engage in conservative therapy including wearing medical grade 1 compression stockings or elevating his legs on a daily basis.  The patient denies any erythema to the lower extremity.  The patient denies any fever, nausea vomiting.  1. Varicose veins of left lower extremity with pain - New The patient was encouraged to wear graduated compression stockings (20-30 mmHg) on a daily basis. The patient was instructed to begin wearing the stockings first thing in the morning and removing them in the evening. The patient was instructed specifically not to sleep in the stockings. Prescription given. In addition, behavioral modification including elevation during the day will be initiated. We discussed a lymphedema pump if conventional therapy goes not work. The patient was advised to follow up in one months after wearing her compression stockings daily with elevation. Information on compression stockings, lymphedema and  the lymphedema pump was given to the patient. The patient was instructed to call the office in the interim if any worsening edema or ulcerations to the legs, feet or toes occurs. The patient expresses their understanding.  2. Peripheral arterial disease (HCC) - Stable Pedal pulses are notable on exam Patient is presenting asymptomatically No indication for intervention at this time  3. Hyperlipidemia, unspecified hyperlipidemia type - Stable Encouraged good control as its slows the progression of atherosclerotic disease  Current Outpatient Medications on File Prior to Visit  Medication Sig Dispense Refill  . amLODipine (NORVASC) 10 MG tablet     . levothyroxine (SYNTHROID, LEVOTHROID) 25 MCG tablet Take 25 mcg by mouth daily before breakfast.    . lisinopril (PRINIVIL,ZESTRIL) 20  MG tablet     . metoprolol succinate (TOPROL-XL) 25 MG 24 hr tablet Take by mouth.    . mupirocin cream (BACTROBAN) 2 % Apply 1 application topically 2 (two) times daily. 15 g 0  . NEOMYCIN-POLYMYXIN-HYDROCORTISONE (CORTISPORIN) 1 % SOLN otic solution Apply 1-2 drops to toe BID after soaking 10 mL 1  . omeprazole (PRILOSEC) 40 MG capsule Take 40 mg by mouth daily.    . simvastatin (ZOCOR) 20 MG tablet Take by mouth.    . sotalol (BETAPACE) 120 MG tablet Take by mouth.    . warfarin (COUMADIN) 1 MG tablet Take 1 mg by mouth daily.    Marland Kitchen warfarin (COUMADIN) 5 MG tablet Take by mouth.    Marland Kitchen amoxicillin (AMOXIL) 500 MG tablet Take 500 mg by mouth 2 (two) times daily.    . cephALEXin (KEFLEX) 500 MG capsule Take 1 capsule (500 mg total) by mouth 3 (three) times daily. (Patient not taking: Reported on 09/18/2017) 30 capsule 2   No current facility-administered medications on file prior to visit.    There are no Patient Instructions on file for this visit. No Follow-up on file.  Armelia Penton A Kaladin Noseworthy, PA-C

## 2017-10-07 DIAGNOSIS — M25562 Pain in left knee: Secondary | ICD-10-CM | POA: Diagnosis not present

## 2017-10-07 DIAGNOSIS — M1732 Unilateral post-traumatic osteoarthritis, left knee: Secondary | ICD-10-CM | POA: Diagnosis not present

## 2017-10-09 DIAGNOSIS — Z0000 Encounter for general adult medical examination without abnormal findings: Secondary | ICD-10-CM | POA: Diagnosis not present

## 2017-10-09 DIAGNOSIS — I10 Essential (primary) hypertension: Secondary | ICD-10-CM | POA: Diagnosis not present

## 2017-10-09 DIAGNOSIS — Z6825 Body mass index (BMI) 25.0-25.9, adult: Secondary | ICD-10-CM | POA: Diagnosis not present

## 2017-10-09 DIAGNOSIS — G894 Chronic pain syndrome: Secondary | ICD-10-CM | POA: Diagnosis not present

## 2017-10-09 DIAGNOSIS — E663 Overweight: Secondary | ICD-10-CM | POA: Diagnosis not present

## 2017-10-09 DIAGNOSIS — G4700 Insomnia, unspecified: Secondary | ICD-10-CM | POA: Diagnosis not present

## 2017-10-09 DIAGNOSIS — M159 Polyosteoarthritis, unspecified: Secondary | ICD-10-CM | POA: Diagnosis not present

## 2017-10-09 DIAGNOSIS — F1120 Opioid dependence, uncomplicated: Secondary | ICD-10-CM | POA: Diagnosis not present

## 2017-10-09 DIAGNOSIS — M1991 Primary osteoarthritis, unspecified site: Secondary | ICD-10-CM | POA: Diagnosis not present

## 2017-10-09 DIAGNOSIS — Z7901 Long term (current) use of anticoagulants: Secondary | ICD-10-CM | POA: Diagnosis not present

## 2017-10-10 DIAGNOSIS — Z1389 Encounter for screening for other disorder: Secondary | ICD-10-CM | POA: Diagnosis not present

## 2017-10-10 DIAGNOSIS — Z6825 Body mass index (BMI) 25.0-25.9, adult: Secondary | ICD-10-CM | POA: Diagnosis not present

## 2017-10-10 DIAGNOSIS — E663 Overweight: Secondary | ICD-10-CM | POA: Diagnosis not present

## 2017-10-13 DIAGNOSIS — R0989 Other specified symptoms and signs involving the circulatory and respiratory systems: Secondary | ICD-10-CM | POA: Diagnosis not present

## 2017-10-13 DIAGNOSIS — E7800 Pure hypercholesterolemia, unspecified: Secondary | ICD-10-CM | POA: Diagnosis not present

## 2017-10-13 DIAGNOSIS — I10 Essential (primary) hypertension: Secondary | ICD-10-CM | POA: Diagnosis not present

## 2017-10-13 DIAGNOSIS — I481 Persistent atrial fibrillation: Secondary | ICD-10-CM | POA: Diagnosis not present

## 2017-10-13 DIAGNOSIS — E7849 Other hyperlipidemia: Secondary | ICD-10-CM | POA: Diagnosis not present

## 2017-10-13 DIAGNOSIS — D151 Benign neoplasm of heart: Secondary | ICD-10-CM | POA: Diagnosis not present

## 2017-10-13 DIAGNOSIS — I639 Cerebral infarction, unspecified: Secondary | ICD-10-CM | POA: Diagnosis not present

## 2017-10-13 DIAGNOSIS — I482 Chronic atrial fibrillation: Secondary | ICD-10-CM | POA: Diagnosis not present

## 2017-10-13 DIAGNOSIS — I25738 Atherosclerosis of nonautologous biological coronary artery bypass graft(s) with other forms of angina pectoris: Secondary | ICD-10-CM | POA: Diagnosis not present

## 2017-10-16 ENCOUNTER — Ambulatory Visit (INDEPENDENT_AMBULATORY_CARE_PROVIDER_SITE_OTHER): Payer: Medicare HMO | Admitting: Vascular Surgery

## 2017-10-17 DIAGNOSIS — Z7901 Long term (current) use of anticoagulants: Secondary | ICD-10-CM | POA: Diagnosis not present

## 2017-10-23 DIAGNOSIS — M1712 Unilateral primary osteoarthritis, left knee: Secondary | ICD-10-CM | POA: Diagnosis not present

## 2017-10-26 DIAGNOSIS — M1712 Unilateral primary osteoarthritis, left knee: Secondary | ICD-10-CM | POA: Insufficient documentation

## 2017-10-27 DIAGNOSIS — R0989 Other specified symptoms and signs involving the circulatory and respiratory systems: Secondary | ICD-10-CM | POA: Diagnosis not present

## 2017-10-27 DIAGNOSIS — I6523 Occlusion and stenosis of bilateral carotid arteries: Secondary | ICD-10-CM | POA: Diagnosis not present

## 2017-11-03 DIAGNOSIS — Z7901 Long term (current) use of anticoagulants: Secondary | ICD-10-CM | POA: Diagnosis not present

## 2017-11-24 DIAGNOSIS — I4891 Unspecified atrial fibrillation: Secondary | ICD-10-CM | POA: Diagnosis not present

## 2017-11-24 DIAGNOSIS — M5481 Occipital neuralgia: Secondary | ICD-10-CM | POA: Diagnosis not present

## 2017-11-24 DIAGNOSIS — Z1389 Encounter for screening for other disorder: Secondary | ICD-10-CM | POA: Diagnosis not present

## 2017-11-24 DIAGNOSIS — E663 Overweight: Secondary | ICD-10-CM | POA: Diagnosis not present

## 2017-11-24 DIAGNOSIS — Z6825 Body mass index (BMI) 25.0-25.9, adult: Secondary | ICD-10-CM | POA: Diagnosis not present

## 2017-11-24 DIAGNOSIS — M5412 Radiculopathy, cervical region: Secondary | ICD-10-CM | POA: Diagnosis not present

## 2017-11-24 DIAGNOSIS — G894 Chronic pain syndrome: Secondary | ICD-10-CM | POA: Diagnosis not present

## 2017-12-10 ENCOUNTER — Other Ambulatory Visit: Payer: Self-pay

## 2017-12-10 ENCOUNTER — Encounter
Admission: RE | Admit: 2017-12-10 | Discharge: 2017-12-10 | Disposition: A | Discharge status: Home / Self Care | Payer: Medicare HMO | Source: Ambulatory Visit | Attending: Orthopedic Surgery | Admitting: Orthopedic Surgery

## 2017-12-10 DIAGNOSIS — Z950 Presence of cardiac pacemaker: Secondary | ICD-10-CM | POA: Insufficient documentation

## 2017-12-10 DIAGNOSIS — Z01812 Encounter for preprocedural laboratory examination: Secondary | ICD-10-CM | POA: Diagnosis not present

## 2017-12-10 DIAGNOSIS — Z01810 Encounter for preprocedural cardiovascular examination: Secondary | ICD-10-CM | POA: Insufficient documentation

## 2017-12-10 DIAGNOSIS — I4891 Unspecified atrial fibrillation: Secondary | ICD-10-CM | POA: Insufficient documentation

## 2017-12-10 HISTORY — DX: Hypothyroidism, unspecified: E03.9

## 2017-12-10 HISTORY — DX: Presence of cardiac pacemaker: Z95.0

## 2017-12-10 HISTORY — DX: Cardiac arrhythmia, unspecified: I49.9

## 2017-12-10 HISTORY — DX: Unspecified osteoarthritis, unspecified site: M19.90

## 2017-12-10 LAB — COMPREHENSIVE METABOLIC PANEL
ALT: 19 U/L (ref 17–63)
ANION GAP: 9 (ref 5–15)
AST: 20 U/L (ref 15–41)
Albumin: 5 g/dL (ref 3.5–5.0)
Alkaline Phosphatase: 73 U/L (ref 38–126)
BUN: 17 mg/dL (ref 6–20)
CALCIUM: 9.5 mg/dL (ref 8.9–10.3)
CHLORIDE: 101 mmol/L (ref 101–111)
CO2: 28 mmol/L (ref 22–32)
CREATININE: 0.72 mg/dL (ref 0.61–1.24)
GFR calc non Af Amer: >60 mL/min (ref >60)
Glucose, Bld: 93 mg/dL (ref 65–99)
Potassium: 3.8 mmol/L (ref 3.5–5.1)
SODIUM: 138 mmol/L (ref 135–145)
Total Bilirubin: 0.4 mg/dL (ref 0.3–1.2)
Total Protein: 8.1 g/dL (ref 6.5–8.1)

## 2017-12-10 LAB — URINALYSIS, ROUTINE W REFLEX MICROSCOPIC
BACTERIA UA: NONE SEEN
BILIRUBIN URINE: NEGATIVE
Glucose, UA: NEGATIVE mg/dL
KETONES UR: NEGATIVE mg/dL
LEUKOCYTES UA: NEGATIVE
Nitrite: NEGATIVE
PH: 6 (ref 5.0–8.0)
Protein, ur: NEGATIVE mg/dL
SQUAMOUS EPITHELIAL / LPF: NONE SEEN (ref 0–5)
Specific Gravity, Urine: 1.008 (ref 1.005–1.030)

## 2017-12-10 LAB — CBC
HCT: 43.2 % (ref 40.0–52.0)
Hemoglobin: 14.6 g/dL (ref 13.0–18.0)
MCH: 30.9 pg (ref 26.0–34.0)
MCHC: 33.8 g/dL (ref 32.0–36.0)
MCV: 91.6 fL (ref 80.0–100.0)
PLATELETS: 248 10*3/uL (ref 150–440)
RBC: 4.72 MIL/uL (ref 4.40–5.90)
RDW: 14.8 % — ABNORMAL HIGH (ref 11.5–14.5)
WBC: 10 10*3/uL (ref 3.8–10.6)

## 2017-12-10 LAB — PROTIME-INR
INR: 1.56
Prothrombin Time: 18.5 seconds — ABNORMAL HIGH (ref 11.4–15.2)

## 2017-12-10 LAB — APTT: aPTT: 34 seconds (ref 24–36)

## 2017-12-10 LAB — TYPE AND SCREEN
ABO/RH(D): O POS
Antibody Screen: NEGATIVE

## 2017-12-10 LAB — SURGICAL PCR SCREEN
MRSA, PCR: NEGATIVE
STAPHYLOCOCCUS AUREUS: NEGATIVE

## 2017-12-10 LAB — SEDIMENTATION RATE: SED RATE: 8 mm/h (ref 0–20)

## 2017-12-10 LAB — C-REACTIVE PROTEIN: CRP: 0.9 mg/dL (ref <1.0)

## 2017-12-10 NOTE — Patient Instructions (Signed)
Your procedure is scheduled on: Monday 12/27/17 Report to [facility]. To find out your arrival time please call [phone] between 1PM - 3PM on Friday 12/24/17.  Remember: Instructions that are not followed completely may result in serious medical risk, up to and including death, or upon the discretion of your surgeon and anesthesiologist your surgery may need to be rescheduled.     _X__ 1. Do not eat food after midnight the night before your procedure.                 No gum chewing or hard candies. You may drink clear liquids up to 2 hours                 before you are scheduled to arrive for your surgery- DO not drink clear                 liquids within 2 hours of the start of your surgery.                 Clear Liquids include:  water, apple juice without pulp, clear carbohydrate                 drink such as Clearfast or Gatorade, Black Coffee or Tea (Do not add                 anything to coffee or tea).  __X__2.  On the morning of surgery brush your teeth with toothpaste and water, you                 may rinse your mouth with mouthwash if you wish.  Do not swallow any              toothpaste of mouthwash.     _X__ 3.  No Alcohol for 24 hours before or after surgery.   _X__ 4.  Do Not Smoke or use e-cigarettes For 24 Hours Prior to Your Surgery.                 Do not use any chewable tobacco products for at least 6 hours prior to                 surgery.  ____  5.  Bring all medications with you on the day of surgery if instructed.   __X__  6.  Notify your doctor if there is any change in your medical condition      (cold, fever, infections).     Do not wear jewelry, make-up, hairpins, clips or nail polish. Do not wear lotions, powders, or perfumes.  Do not shave 48 hours prior to surgery. Men may shave face and neck. Do not bring valuables to the hospital.    [facility] is not responsible for any belongings or  valuables.  Contacts, dentures/partials or body piercings may not be worn into surgery. Bring a case for your contacts, glasses or hearing aids, a denture cup will be supplied. Leave your suitcase in the car. After surgery it may be brought to your room. For patients admitted to the hospital, discharge time is determined by your treatment team.   Patients discharged the day of surgery will not be allowed to drive home.   Please read over the following fact sheets that you were given:   MRSA Information  __X__ Take these medicines the morning of surgery with A SIP OF WATER:  1. amlodipine  2. levothyroxine  3. metoprolol  4. omeprazole  5. sotalol  6.  ____ Fleet Enema (as directed)   __X__ Use CHG Soap/SAGE wipes as directed  ____ Use inhalers on the day of surgery  ____ Stop metformin/Janumet/Farxiga 2 days prior to surgery    ____ Take 1/2 of usual insulin dose the night before surgery. No insulin the morning          of surgery.   __X__ Stop Blood Thinners Coumadin/Plavix/Xarelto/Pleta/Pradaxa/Eliquis/Effient/Aspirin  5 DAYS PRIOR AS INSTRUCTED  __X__ Stop Anti-inflammatories 7 days before surgery such as Advil, Ibuprofen, Motrin,  BC or Goodies Powder, Naprosyn, Naproxen, Aleve, Aspirin    __X__ Stop all herbal supplements, fish oil or vitamin E until after surgery.    ____ Bring C-Pap to the hospital.

## 2017-12-11 DIAGNOSIS — Z7901 Long term (current) use of anticoagulants: Secondary | ICD-10-CM | POA: Diagnosis not present

## 2017-12-11 LAB — URINE CULTURE: CULTURE: NO GROWTH

## 2017-12-11 NOTE — Pre-Procedure Instructions (Signed)
PACEMAKER SHEET FAXED TO DR Clayborn Bigness

## 2017-12-17 DIAGNOSIS — H9319 Tinnitus, unspecified ear: Secondary | ICD-10-CM | POA: Diagnosis not present

## 2017-12-17 DIAGNOSIS — Z7901 Long term (current) use of anticoagulants: Secondary | ICD-10-CM | POA: Diagnosis not present

## 2017-12-17 DIAGNOSIS — J342 Deviated nasal septum: Secondary | ICD-10-CM | POA: Diagnosis not present

## 2017-12-17 DIAGNOSIS — H903 Sensorineural hearing loss, bilateral: Secondary | ICD-10-CM | POA: Diagnosis not present

## 2017-12-21 MED ORDER — CEFAZOLIN SODIUM-DEXTROSE 2-4 GM/100ML-% IV SOLN
2.0000 g | INTRAVENOUS | Status: AC
  Administered 2017-12-22: 2 g via INTRAVENOUS

## 2017-12-21 MED ORDER — TRANEXAMIC ACID 1000 MG/10ML IV SOLN
1000.0000 mg | INTRAVENOUS | Status: AC
  Administered 2017-12-22: 1000 mg via INTRAVENOUS
  Filled 2017-12-21: qty 10

## 2017-12-22 ENCOUNTER — Other Ambulatory Visit: Payer: Self-pay

## 2017-12-22 ENCOUNTER — Inpatient Hospital Stay: Payer: Medicare HMO

## 2017-12-22 ENCOUNTER — Inpatient Hospital Stay: Payer: Medicare HMO | Admitting: Anesthesiology

## 2017-12-22 ENCOUNTER — Inpatient Hospital Stay
Admission: RE | Admit: 2017-12-22 | Discharge: 2017-12-24 | DRG: 470 | Disposition: A | Discharge status: Home Health | Payer: Medicare HMO | Source: Ambulatory Visit | Attending: Orthopedic Surgery | Admitting: Orthopedic Surgery

## 2017-12-22 ENCOUNTER — Encounter
Admission: RE | Disposition: A | Discharge status: Home Health | Payer: Self-pay | Source: Ambulatory Visit | Attending: Orthopedic Surgery

## 2017-12-22 ENCOUNTER — Encounter: Payer: Self-pay | Admitting: Orthopedic Surgery

## 2017-12-22 DIAGNOSIS — I4891 Unspecified atrial fibrillation: Secondary | ICD-10-CM | POA: Diagnosis not present

## 2017-12-22 DIAGNOSIS — I2510 Atherosclerotic heart disease of native coronary artery without angina pectoris: Secondary | ICD-10-CM | POA: Diagnosis present

## 2017-12-22 DIAGNOSIS — I639 Cerebral infarction, unspecified: Secondary | ICD-10-CM | POA: Insufficient documentation

## 2017-12-22 DIAGNOSIS — E039 Hypothyroidism, unspecified: Secondary | ICD-10-CM | POA: Diagnosis not present

## 2017-12-22 DIAGNOSIS — I10 Essential (primary) hypertension: Secondary | ICD-10-CM | POA: Diagnosis present

## 2017-12-22 DIAGNOSIS — Z5181 Encounter for therapeutic drug level monitoring: Secondary | ICD-10-CM | POA: Insufficient documentation

## 2017-12-22 DIAGNOSIS — Z951 Presence of aortocoronary bypass graft: Secondary | ICD-10-CM

## 2017-12-22 DIAGNOSIS — E785 Hyperlipidemia, unspecified: Secondary | ICD-10-CM | POA: Diagnosis present

## 2017-12-22 DIAGNOSIS — R001 Bradycardia, unspecified: Secondary | ICD-10-CM | POA: Insufficient documentation

## 2017-12-22 DIAGNOSIS — Z950 Presence of cardiac pacemaker: Secondary | ICD-10-CM | POA: Diagnosis not present

## 2017-12-22 DIAGNOSIS — D151 Benign neoplasm of heart: Secondary | ICD-10-CM | POA: Insufficient documentation

## 2017-12-22 DIAGNOSIS — I739 Peripheral vascular disease, unspecified: Secondary | ICD-10-CM | POA: Diagnosis not present

## 2017-12-22 DIAGNOSIS — M25562 Pain in left knee: Secondary | ICD-10-CM | POA: Diagnosis not present

## 2017-12-22 DIAGNOSIS — M1712 Unilateral primary osteoarthritis, left knee: Secondary | ICD-10-CM | POA: Diagnosis not present

## 2017-12-22 DIAGNOSIS — M1990 Unspecified osteoarthritis, unspecified site: Secondary | ICD-10-CM | POA: Diagnosis not present

## 2017-12-22 DIAGNOSIS — Z96652 Presence of left artificial knee joint: Secondary | ICD-10-CM

## 2017-12-22 DIAGNOSIS — Z7901 Long term (current) use of anticoagulants: Secondary | ICD-10-CM | POA: Insufficient documentation

## 2017-12-22 DIAGNOSIS — Z79899 Other long term (current) drug therapy: Secondary | ICD-10-CM | POA: Insufficient documentation

## 2017-12-22 DIAGNOSIS — Z96659 Presence of unspecified artificial knee joint: Secondary | ICD-10-CM

## 2017-12-22 HISTORY — PX: KNEE ARTHROPLASTY: SHX992

## 2017-12-22 LAB — ABO/RH: ABO/RH(D): O POS

## 2017-12-22 LAB — PROTIME-INR
INR: 0.96
Prothrombin Time: 12.7 seconds (ref 11.4–15.2)

## 2017-12-22 SURGERY — ARTHROPLASTY, KNEE, TOTAL, USING IMAGELESS COMPUTER-ASSISTED NAVIGATION
Anesthesia: Spinal | Site: Knee | Laterality: Left | Wound class: Clean

## 2017-12-22 MED ORDER — WARFARIN - PHYSICIAN DOSING INPATIENT: Freq: Every day | Status: DC

## 2017-12-22 MED ORDER — CHLORHEXIDINE GLUCONATE 4 % EX LIQD: 60.0000 mL | Freq: Once | CUTANEOUS | Status: DC

## 2017-12-22 MED ORDER — BUPIVACAINE HCL (PF) 0.25 % IJ SOLN
INTRAMUSCULAR | Status: AC
  Filled 2017-12-22: qty 60

## 2017-12-22 MED ORDER — DIPHENHYDRAMINE HCL 12.5 MG/5ML PO ELIX: 12.5000 mg | ORAL_SOLUTION | ORAL | Status: DC | PRN

## 2017-12-22 MED ORDER — METOCLOPRAMIDE HCL 10 MG PO TABS: 5.0000 mg | ORAL_TABLET | Freq: Three times a day (TID) | ORAL | Status: DC | PRN

## 2017-12-22 MED ORDER — OXYCODONE HCL 5 MG PO TABS
10.0000 mg | ORAL_TABLET | ORAL | Status: DC | PRN
  Administered 2017-12-22 (×2): 10 mg via ORAL
  Filled 2017-12-22 (×2): qty 2

## 2017-12-22 MED ORDER — PHENYLEPHRINE HCL 10 MG/ML IJ SOLN
INTRAMUSCULAR | Status: AC
  Filled 2017-12-22: qty 1

## 2017-12-22 MED ORDER — LEVOTHYROXINE SODIUM 25 MCG PO TABS
25.0000 ug | ORAL_TABLET | Freq: Every day | ORAL | Status: DC
  Administered 2017-12-23 – 2017-12-24 (×2): 25 ug via ORAL
  Filled 2017-12-22 (×2): qty 1

## 2017-12-22 MED ORDER — TRANEXAMIC ACID 1000 MG/10ML IV SOLN
1000.0000 mg | Freq: Once | INTRAVENOUS | Status: AC
  Administered 2017-12-22: 1000 mg via INTRAVENOUS
  Filled 2017-12-22: qty 10

## 2017-12-22 MED ORDER — ONDANSETRON HCL 4 MG/2ML IJ SOLN: 4.0000 mg | Freq: Four times a day (QID) | INTRAMUSCULAR | Status: DC | PRN

## 2017-12-22 MED ORDER — LISINOPRIL 20 MG PO TABS
20.0000 mg | ORAL_TABLET | Freq: Every day | ORAL | Status: DC
  Administered 2017-12-23 – 2017-12-24 (×2): 20 mg via ORAL
  Filled 2017-12-22 (×2): qty 1

## 2017-12-22 MED ORDER — ACETAMINOPHEN 10 MG/ML IV SOLN
1000.0000 mg | Freq: Four times a day (QID) | INTRAVENOUS | Status: AC
  Administered 2017-12-22 – 2017-12-23 (×4): 1000 mg via INTRAVENOUS
  Filled 2017-12-22 (×4): qty 100

## 2017-12-22 MED ORDER — CEFAZOLIN SODIUM-DEXTROSE 2-4 GM/100ML-% IV SOLN
INTRAVENOUS | Status: AC
  Filled 2017-12-22: qty 100

## 2017-12-22 MED ORDER — SIMVASTATIN 20 MG PO TABS
20.0000 mg | ORAL_TABLET | Freq: Every evening | ORAL | Status: DC
  Administered 2017-12-22 – 2017-12-23 (×2): 20 mg via ORAL
  Filled 2017-12-22 (×2): qty 1

## 2017-12-22 MED ORDER — SODIUM CHLORIDE 0.9 % IV SOLN
INTRAVENOUS | Status: DC | PRN
  Administered 2017-12-22: 30 ug/min via INTRAVENOUS

## 2017-12-22 MED ORDER — CELECOXIB 200 MG PO CAPS
200.0000 mg | ORAL_CAPSULE | Freq: Two times a day (BID) | ORAL | Status: DC
  Administered 2017-12-22 – 2017-12-24 (×4): 200 mg via ORAL
  Filled 2017-12-22 (×4): qty 1

## 2017-12-22 MED ORDER — OXYCODONE HCL 5 MG PO TABS: 5.0000 mg | ORAL_TABLET | Freq: Once | ORAL | Status: DC | PRN

## 2017-12-22 MED ORDER — SOTALOL HCL 80 MG PO TABS
120.0000 mg | ORAL_TABLET | Freq: Two times a day (BID) | ORAL | Status: DC
  Administered 2017-12-22 – 2017-12-24 (×4): 120 mg via ORAL
  Filled 2017-12-22 (×7): qty 1.5

## 2017-12-22 MED ORDER — ACETAMINOPHEN 10 MG/ML IV SOLN
INTRAVENOUS | Status: AC
  Filled 2017-12-22: qty 100

## 2017-12-22 MED ORDER — GABAPENTIN 300 MG PO CAPS
300.0000 mg | ORAL_CAPSULE | Freq: Every day | ORAL | Status: DC
  Administered 2017-12-22 – 2017-12-23 (×2): 300 mg via ORAL
  Filled 2017-12-22 (×2): qty 1

## 2017-12-22 MED ORDER — PANTOPRAZOLE SODIUM 40 MG PO TBEC
40.0000 mg | DELAYED_RELEASE_TABLET | Freq: Two times a day (BID) | ORAL | Status: DC
  Administered 2017-12-22 – 2017-12-24 (×5): 40 mg via ORAL
  Filled 2017-12-22 (×5): qty 1

## 2017-12-22 MED ORDER — SODIUM CHLORIDE 0.9 % IV SOLN
0.0000 ug/min | INTRAVENOUS | Status: DC
  Filled 2017-12-22: qty 1

## 2017-12-22 MED ORDER — DEXAMETHASONE SODIUM PHOSPHATE 10 MG/ML IJ SOLN
INTRAMUSCULAR | Status: AC
  Filled 2017-12-22: qty 1

## 2017-12-22 MED ORDER — BISACODYL 10 MG RE SUPP
10.0000 mg | Freq: Every day | RECTAL | Status: DC | PRN
  Administered 2017-12-24: 10 mg via RECTAL
  Filled 2017-12-22: qty 1

## 2017-12-22 MED ORDER — FLUOROURACIL 5 % EX CREA: 1.0000 "application " | TOPICAL_CREAM | Freq: Two times a day (BID) | CUTANEOUS | Status: DC | PRN

## 2017-12-22 MED ORDER — PROPOFOL 500 MG/50ML IV EMUL
INTRAVENOUS | Status: AC
  Filled 2017-12-22: qty 50

## 2017-12-22 MED ORDER — MIDAZOLAM HCL 2 MG/2ML IJ SOLN
INTRAMUSCULAR | Status: AC
  Filled 2017-12-22: qty 2

## 2017-12-22 MED ORDER — ACETAMINOPHEN 10 MG/ML IV SOLN
INTRAVENOUS | Status: DC | PRN
  Administered 2017-12-22: 1000 mg via INTRAVENOUS

## 2017-12-22 MED ORDER — CEFAZOLIN SODIUM-DEXTROSE 2-4 GM/100ML-% IV SOLN
2.0000 g | Freq: Four times a day (QID) | INTRAVENOUS | Status: AC
  Administered 2017-12-22 – 2017-12-23 (×4): 2 g via INTRAVENOUS
  Filled 2017-12-22 (×4): qty 100

## 2017-12-22 MED ORDER — PROPOFOL 500 MG/50ML IV EMUL
INTRAVENOUS | Status: DC | PRN
  Administered 2017-12-22: 50 ug/kg/min via INTRAVENOUS

## 2017-12-22 MED ORDER — GABAPENTIN 300 MG PO CAPS
300.0000 mg | ORAL_CAPSULE | Freq: Once | ORAL | Status: AC
  Administered 2017-12-22: 300 mg via ORAL

## 2017-12-22 MED ORDER — ACETAMINOPHEN 325 MG PO TABS: 325.0000 mg | ORAL_TABLET | Freq: Four times a day (QID) | ORAL | Status: DC | PRN

## 2017-12-22 MED ORDER — BUPIVACAINE HCL (PF) 0.5 % IJ SOLN
INTRAMUSCULAR | Status: DC | PRN
  Administered 2017-12-22: 2.6 mL

## 2017-12-22 MED ORDER — MIDAZOLAM HCL 5 MG/5ML IJ SOLN
INTRAMUSCULAR | Status: DC | PRN
  Administered 2017-12-22: 0.5 mg via INTRAVENOUS

## 2017-12-22 MED ORDER — BUPIVACAINE LIPOSOME 1.3 % IJ SUSP
INTRAMUSCULAR | Status: DC | PRN
  Administered 2017-12-22: 60 mL

## 2017-12-22 MED ORDER — DEXAMETHASONE SODIUM PHOSPHATE 10 MG/ML IJ SOLN
8.0000 mg | Freq: Once | INTRAMUSCULAR | Status: AC
  Administered 2017-12-22: 8 mg via INTRAVENOUS

## 2017-12-22 MED ORDER — TRAMADOL HCL 50 MG PO TABS
50.0000 mg | ORAL_TABLET | ORAL | Status: DC | PRN
  Administered 2017-12-24: 50 mg via ORAL
  Filled 2017-12-22: qty 1

## 2017-12-22 MED ORDER — TIZANIDINE HCL 4 MG PO TABS
4.0000 mg | ORAL_TABLET | Freq: Three times a day (TID) | ORAL | Status: DC
  Administered 2017-12-22 – 2017-12-24 (×6): 4 mg via ORAL
  Filled 2017-12-22 (×6): qty 1

## 2017-12-22 MED ORDER — SENNOSIDES-DOCUSATE SODIUM 8.6-50 MG PO TABS
1.0000 | ORAL_TABLET | Freq: Two times a day (BID) | ORAL | Status: DC
  Administered 2017-12-22 – 2017-12-24 (×4): 1 via ORAL
  Filled 2017-12-22 (×4): qty 1

## 2017-12-22 MED ORDER — LACTATED RINGERS IV SOLN
INTRAVENOUS | Status: DC
  Administered 2017-12-22 (×2): via INTRAVENOUS

## 2017-12-22 MED ORDER — OXYCODONE HCL 5 MG PO TABS
5.0000 mg | ORAL_TABLET | ORAL | Status: DC | PRN
  Administered 2017-12-23: 5 mg via ORAL
  Filled 2017-12-22 (×2): qty 1

## 2017-12-22 MED ORDER — BUPIVACAINE HCL (PF) 0.5 % IJ SOLN
INTRAMUSCULAR | Status: AC
  Filled 2017-12-22: qty 10

## 2017-12-22 MED ORDER — FENTANYL CITRATE (PF) 100 MCG/2ML IJ SOLN
INTRAMUSCULAR | Status: DC | PRN
  Administered 2017-12-22: 50 ug via INTRAVENOUS

## 2017-12-22 MED ORDER — MENTHOL 3 MG MT LOZG
1.0000 | LOZENGE | OROMUCOSAL | Status: DC | PRN
  Filled 2017-12-22: qty 9

## 2017-12-22 MED ORDER — ALUM & MAG HYDROXIDE-SIMETH 200-200-20 MG/5ML PO SUSP: 30.0000 mL | ORAL | Status: DC | PRN

## 2017-12-22 MED ORDER — HYDROMORPHONE HCL 1 MG/ML IJ SOLN
0.5000 mg | INTRAMUSCULAR | Status: DC | PRN
  Administered 2017-12-22: 0.5 mg via INTRAVENOUS
  Filled 2017-12-22: qty 1

## 2017-12-22 MED ORDER — FENTANYL CITRATE (PF) 100 MCG/2ML IJ SOLN
INTRAMUSCULAR | Status: AC
  Filled 2017-12-22: qty 2

## 2017-12-22 MED ORDER — FERROUS SULFATE 325 (65 FE) MG PO TABS
325.0000 mg | ORAL_TABLET | Freq: Two times a day (BID) | ORAL | Status: DC
  Administered 2017-12-22 – 2017-12-24 (×4): 325 mg via ORAL
  Filled 2017-12-22 (×4): qty 1

## 2017-12-22 MED ORDER — PHENOL 1.4 % MT LIQD
1.0000 | OROMUCOSAL | Status: DC | PRN
  Filled 2017-12-22: qty 177

## 2017-12-22 MED ORDER — METOCLOPRAMIDE HCL 10 MG PO TABS
10.0000 mg | ORAL_TABLET | Freq: Three times a day (TID) | ORAL | Status: DC
  Administered 2017-12-22 – 2017-12-24 (×7): 10 mg via ORAL
  Filled 2017-12-22 (×7): qty 1

## 2017-12-22 MED ORDER — BUPIVACAINE LIPOSOME 1.3 % IJ SUSP
INTRAMUSCULAR | Status: AC
  Filled 2017-12-22: qty 20

## 2017-12-22 MED ORDER — LIDOCAINE HCL (PF) 2 % IJ SOLN
INTRAMUSCULAR | Status: AC
  Filled 2017-12-22: qty 10

## 2017-12-22 MED ORDER — MAGNESIUM HYDROXIDE 400 MG/5ML PO SUSP
30.0000 mL | Freq: Every day | ORAL | Status: DC
  Administered 2017-12-22 – 2017-12-24 (×3): 30 mL via ORAL
  Filled 2017-12-22 (×3): qty 30

## 2017-12-22 MED ORDER — PROPOFOL 10 MG/ML IV BOLUS
INTRAVENOUS | Status: DC | PRN
  Administered 2017-12-22 (×5): 17.5 mg via INTRAVENOUS

## 2017-12-22 MED ORDER — AMLODIPINE BESYLATE 10 MG PO TABS
10.0000 mg | ORAL_TABLET | Freq: Every day | ORAL | Status: DC
  Administered 2017-12-23 – 2017-12-24 (×2): 10 mg via ORAL
  Filled 2017-12-22 (×2): qty 1

## 2017-12-22 MED ORDER — GABAPENTIN 300 MG PO CAPS
ORAL_CAPSULE | ORAL | Status: AC
  Filled 2017-12-22: qty 1

## 2017-12-22 MED ORDER — VASOPRESSIN 20 UNIT/ML IV SOLN
INTRAVENOUS | Status: DC | PRN
  Administered 2017-12-22: 3 [IU] via INTRAVENOUS

## 2017-12-22 MED ORDER — WARFARIN - PHARMACIST DOSING INPATIENT
Freq: Every day | Status: DC
Start: 2017-12-22 — End: 2017-12-22

## 2017-12-22 MED ORDER — BUPIVACAINE HCL (PF) 0.25 % IJ SOLN
INTRAMUSCULAR | Status: DC | PRN
  Administered 2017-12-22: 60 mL

## 2017-12-22 MED ORDER — NEOMYCIN-POLYMYXIN B GU 40-200000 IR SOLN
Status: AC
  Filled 2017-12-22: qty 20

## 2017-12-22 MED ORDER — FLEET ENEMA 7-19 GM/118ML RE ENEM: 1.0000 | ENEMA | Freq: Once | RECTAL | Status: DC | PRN

## 2017-12-22 MED ORDER — METOCLOPRAMIDE HCL 5 MG/ML IJ SOLN: 5.0000 mg | Freq: Three times a day (TID) | INTRAMUSCULAR | Status: DC | PRN

## 2017-12-22 MED ORDER — GLYCOPYRROLATE 0.2 MG/ML IJ SOLN
INTRAMUSCULAR | Status: DC | PRN
  Administered 2017-12-22: 0.2 mg via INTRAVENOUS

## 2017-12-22 MED ORDER — WARFARIN SODIUM 4 MG PO TABS
4.0000 mg | ORAL_TABLET | Freq: Every day | ORAL | Status: DC
  Administered 2017-12-22 – 2017-12-23 (×2): 4 mg via ORAL
  Filled 2017-12-22 (×2): qty 1

## 2017-12-22 MED ORDER — TETRACAINE HCL 1 % IJ SOLN
INTRAMUSCULAR | Status: DC | PRN
  Administered 2017-12-22: 4 mg via INTRASPINAL

## 2017-12-22 MED ORDER — NEOMYCIN-POLYMYXIN B GU 40-200000 IR SOLN
Status: DC | PRN
  Administered 2017-12-22: 14 mL

## 2017-12-22 MED ORDER — METOPROLOL SUCCINATE ER 25 MG PO TB24
25.0000 mg | ORAL_TABLET | Freq: Every day | ORAL | Status: DC
  Administered 2017-12-23 – 2017-12-24 (×2): 25 mg via ORAL
  Filled 2017-12-22 (×2): qty 1

## 2017-12-22 MED ORDER — WARFARIN SODIUM 4 MG PO TABS
4.0000 mg | ORAL_TABLET | Freq: Every day | ORAL | Status: DC
  Filled 2017-12-22: qty 1

## 2017-12-22 MED ORDER — FENTANYL CITRATE (PF) 100 MCG/2ML IJ SOLN: 25.0000 ug | INTRAMUSCULAR | Status: DC | PRN

## 2017-12-22 MED ORDER — ONDANSETRON HCL 4 MG PO TABS: 4.0000 mg | ORAL_TABLET | Freq: Four times a day (QID) | ORAL | Status: DC | PRN

## 2017-12-22 MED ORDER — SODIUM CHLORIDE 0.9 % IV SOLN
INTRAVENOUS | Status: DC
  Administered 2017-12-22 – 2017-12-23 (×3): via INTRAVENOUS

## 2017-12-22 MED ORDER — CELECOXIB 200 MG PO CAPS
400.0000 mg | ORAL_CAPSULE | Freq: Once | ORAL | Status: AC
Start: 2017-12-22 — End: 2017-12-22
  Administered 2017-12-22: 400 mg via ORAL

## 2017-12-22 MED ORDER — CELECOXIB 200 MG PO CAPS
ORAL_CAPSULE | ORAL | Status: AC
  Filled 2017-12-22: qty 2

## 2017-12-22 MED ORDER — OXYCODONE HCL 5 MG/5ML PO SOLN: 5.0000 mg | Freq: Once | ORAL | Status: DC | PRN

## 2017-12-22 SURGICAL SUPPLY — 65 items
BATTERY INSTRU NAVIGATION (MISCELLANEOUS) ×12 IMPLANT
BLADE SAGITTAL 25.0X1.19X90 (BLADE) ×2 IMPLANT
BLADE SAGITTAL 25.0X1.19X90MM (BLADE) ×1
BLADE SAW 1/2 (BLADE) ×3 IMPLANT
BLADE SAW 70X12.5 (BLADE) IMPLANT
BTRY SRG DRVR LF (MISCELLANEOUS) ×4
CANISTER SUCT 1200ML W/VALVE (MISCELLANEOUS) ×3 IMPLANT
CANISTER SUCT 3000ML PPV (MISCELLANEOUS) ×6 IMPLANT
CAPT KNEE TOTAL 3 ATTUNE ×3 IMPLANT
CEMENT HV SMART SET (Cement) ×6 IMPLANT
COOLER POLAR GLACIER W/PUMP (MISCELLANEOUS) ×3 IMPLANT
CUFF TOURN 24 STER (MISCELLANEOUS) IMPLANT
CUFF TOURN 30 STER DUAL PORT (MISCELLANEOUS) IMPLANT
DRAPE SHEET LG 3/4 BI-LAMINATE (DRAPES) ×3 IMPLANT
DRSG DERMACEA 8X12 NADH (GAUZE/BANDAGES/DRESSINGS) ×3 IMPLANT
DRSG OPSITE POSTOP 4X14 (GAUZE/BANDAGES/DRESSINGS) ×3 IMPLANT
DRSG TEGADERM 4X4.75 (GAUZE/BANDAGES/DRESSINGS) ×3 IMPLANT
DURAPREP 26ML APPLICATOR (WOUND CARE) ×6 IMPLANT
ELECT CAUTERY BLADE 6.4 (BLADE) ×3 IMPLANT
ELECT REM PT RETURN 9FT ADLT (ELECTROSURGICAL) ×3
ELECTRODE REM PT RTRN 9FT ADLT (ELECTROSURGICAL) ×1 IMPLANT
EX-PIN ORTHOLOCK NAV 4X150 (PIN) ×6 IMPLANT
GLOVE BIOGEL M STRL SZ7.5 (GLOVE) ×6 IMPLANT
GLOVE BIOGEL PI IND STRL 9 (GLOVE) ×1 IMPLANT
GLOVE BIOGEL PI INDICATOR 9 (GLOVE) ×2
GLOVE INDICATOR 8.0 STRL GRN (GLOVE) ×3 IMPLANT
GLOVE SURG SYN 9.0  PF PI (GLOVE) ×2
GLOVE SURG SYN 9.0 PF PI (GLOVE) ×1 IMPLANT
GOWN STRL REUS W/ TWL LRG LVL3 (GOWN DISPOSABLE) ×2 IMPLANT
GOWN STRL REUS W/TWL 2XL LVL3 (GOWN DISPOSABLE) ×3 IMPLANT
GOWN STRL REUS W/TWL LRG LVL3 (GOWN DISPOSABLE) ×6
HEMOVAC 400CC 10FR (MISCELLANEOUS) ×3 IMPLANT
HOLDER FOLEY CATH W/STRAP (MISCELLANEOUS) ×3 IMPLANT
HOOD PEEL AWAY FLYTE STAYCOOL (MISCELLANEOUS) ×6 IMPLANT
KIT TURNOVER KIT A (KITS) ×3 IMPLANT
KNIFE SCULPS 14X20 (INSTRUMENTS) ×3 IMPLANT
LABEL OR SOLS (LABEL) ×3 IMPLANT
NDL SAFETY ECLIPSE 18X1.5 (NEEDLE) ×1 IMPLANT
NEEDLE HYPO 18GX1.5 SHARP (NEEDLE) ×3
NEEDLE SPNL 20GX3.5 QUINCKE YW (NEEDLE) ×6 IMPLANT
NS IRRIG 500ML POUR BTL (IV SOLUTION) ×3 IMPLANT
PACK TOTAL KNEE (MISCELLANEOUS) ×3 IMPLANT
PAD WRAPON POLAR KNEE (MISCELLANEOUS) ×1 IMPLANT
PIN DRILL QUICK PACK ×3 IMPLANT
PIN FIXATION 1/8DIA X 3INL (PIN) ×3 IMPLANT
PULSAVAC PLUS IRRIG FAN TIP (DISPOSABLE) ×3
SOL .9 NS 3000ML IRR  AL (IV SOLUTION) ×2
SOL .9 NS 3000ML IRR UROMATIC (IV SOLUTION) ×1 IMPLANT
SOL PREP PVP 2OZ (MISCELLANEOUS) ×3
SOLUTION PREP PVP 2OZ (MISCELLANEOUS) ×1 IMPLANT
SPONGE DRAIN TRACH 4X4 STRL 2S (GAUZE/BANDAGES/DRESSINGS) ×3 IMPLANT
STAPLER SKIN PROX 35W (STAPLE) ×3 IMPLANT
STRAP TIBIA SHORT (MISCELLANEOUS) ×3 IMPLANT
SUCTION FRAZIER HANDLE 10FR (MISCELLANEOUS) ×2
SUCTION TUBE FRAZIER 10FR DISP (MISCELLANEOUS) ×1 IMPLANT
SUT VIC AB 0 CT1 36 (SUTURE) ×3 IMPLANT
SUT VIC AB 1 CT1 36 (SUTURE) ×6 IMPLANT
SUT VIC AB 2-0 CT2 27 (SUTURE) ×3 IMPLANT
SYR 20CC LL (SYRINGE) ×3 IMPLANT
SYR 30ML LL (SYRINGE) ×6 IMPLANT
TIP FAN IRRIG PULSAVAC PLUS (DISPOSABLE) ×1 IMPLANT
TOWEL OR 17X26 4PK STRL BLUE (TOWEL DISPOSABLE) ×3 IMPLANT
TOWER CARTRIDGE SMART MIX (DISPOSABLE) ×3 IMPLANT
TRAY FOLEY W/METER SILVER 16FR (SET/KITS/TRAYS/PACK) ×3 IMPLANT
WRAPON POLAR PAD KNEE (MISCELLANEOUS) ×3

## 2017-12-22 NOTE — Discharge Instructions (Signed)
°  Instructions after Total Knee Replacement ° ° Charitie Hinote P. Daley Mooradian, Jr., M.D.    ° Dept. of Orthopaedics & Sports Medicine ° [facility] ° [address] ° [place], [place]  [address] ° Phone: [phone]   Fax: [phone] ° °  °DIET: °• Drink plenty of non-alcoholic fluids. °• Resume your normal diet. Include foods high in fiber. ° °ACTIVITY:  °• You may use crutches or a walker with weight-bearing as tolerated, unless instructed otherwise. °• You may be weaned off of the walker or crutches by your Physical Therapist.  °• Do NOT place pillows under the knee. Anything placed under the knee could limit your ability to straighten the knee.   °• Continue doing gentle exercises. Exercising will reduce the pain and swelling, increase motion, and prevent muscle weakness.   °• Please continue to use the TED compression stockings for 6 weeks. You may remove the stockings at night, but should reapply them in the morning. °• Do not drive or operate any equipment until instructed. ° °WOUND CARE:  °• Continue to use the PolarCare or ice packs periodically to reduce pain and swelling. °• You may bathe or shower after the staples are removed at the first office visit following surgery. ° °MEDICATIONS: °• You may resume your regular medications. °• Please take the pain medication as prescribed on the medication. °• Do not take pain medication on an empty stomach. °• You have been given a prescription for a blood thinner (Lovenox or Coumadin). Please take the medication as instructed. (NOTE: After completing a 2 week course of Lovenox, take one Enteric-coated aspirin once a day. This along with elevation will help reduce the possibility of phlebitis in your operated leg.) °• Do not drive or drink alcoholic beverages when taking pain medications. ° °CALL THE OFFICE FOR: °• Temperature above 101 degrees °• Excessive bleeding or drainage on the dressing. °• Excessive swelling, coldness, or paleness of the toes. °• Persistent  nausea and vomiting. ° °FOLLOW-UP:  °• You should have an appointment to return to the office in 10-14 days after surgery. °• Arrangements have been made for continuation of Physical Therapy (either home therapy or outpatient therapy). °  °

## 2017-12-22 NOTE — Progress Notes (Signed)
ANTICOAGULATION CONSULT NOTE - Initial Consult  Pharmacy Consult for warfarin Indication: atrial fibrillation  No Known Allergies  Patient Measurements: Height: 5\' 4"  (162.6 cm) Weight: 160 lb (72.6 kg) IBW/kg (Calculated) : 59.2   Vital Signs: Temp: 97.8 F (36.6 C) (05/13 0601) Temp Source: Oral (05/13 0601) BP: 160/81 (05/13 0601) Pulse Rate: 85 (05/13 0601)  Labs: Recent Labs    12/22/17 0618  LABPROT 12.7  INR 0.96    Estimated Creatinine Clearance: 70.7 mL/min (by C-G formula based on SCr of 0.72 mg/dL).   Medical History: Past Medical History:  Diagnosis Date  . Arthritis   . Atrial fibrillation ([place])   . Cardiac pacemaker in situ   . Coronary artery disease due to lipid rich plaque    history of coronary artery bypass grafting at [facility] 2007  . Dysrhythmia   . Hyperlipidemia   . Hypertension   . Hypothyroidism   . Presence of permanent cardiac pacemaker     Medications:  Scheduled:  . celecoxib      . chlorhexidine  60 mL Topical Once  . dexamethasone      . gabapentin      . warfarin  4 mg Oral q1800    Assessment: Patient scheduled for procedure w/ MD [facility]. Patient has a h/o of afib anticoagulated w/ warfarin. Patient takes warfarin 4 mg daily PTA.  05/13 INR 0.96 INR subtherapeutic  Goal of Therapy:  INR 2-3 Monitor platelets by anticoagulation protocol: Yes   Plan:  Will continue patient's home dose of warfarin 4 mg daily post-op. Will continue to monitor daily CBC's and will adjust per INR trends.  Tobie Lords, PharmD, BCPS Clinical Pharmacist 12/22/2017

## 2017-12-22 NOTE — Progress Notes (Signed)
PT Cancellation Note  Patient Details Name: [name] MRN: [id number] DOB: [birth year]   Cancelled Treatment:    Reason Eval/Treat Not Completed: Medical issues which prohibited therapy; PT eval held this date secondary to pt presenting with limited B ankle AROM and limited sensation to light touch most notably on the LLE.  Will attempt to see pt tomorrow in the AM as medically appropriate.     Linus Salmons PT, DPT 12/22/17, 3:46 PM

## 2017-12-22 NOTE — Anesthesia Preprocedure Evaluation (Addendum)
Anesthesia Evaluation  Patient identified by MRN, date of birth, ID band Patient awake    Reviewed: Allergy & Precautions, H&P , NPO status , Patient's Chart, lab work & pertinent test results  History of Anesthesia Complications Negative for: history of anesthetic complications  Airway Mallampati: III  TM Distance: <3 FB Neck ROM: limited    Dental  (+) Chipped, Upper Dentures, Poor Dentition, Missing, Partial Lower   Pulmonary neg shortness of breath,  Signs and symptoms suggestive of sleep apnea            Cardiovascular Exercise Tolerance: Good hypertension, (-) angina+ CAD and + Peripheral Vascular Disease  (-) DOE + dysrhythmias + pacemaker      Neuro/Psych CVA negative psych ROS   GI/Hepatic negative GI ROS, Neg liver ROS,   Endo/Other  Hypothyroidism   Renal/GU      Musculoskeletal  (+) Arthritis ,   Abdominal   Peds  Hematology negative hematology ROS (+)   Anesthesia Other Findings Past Medical History: No date: Arthritis No date: Atrial fibrillation (HCC) No date: Cardiac pacemaker in situ No date: Coronary artery disease due to lipid rich plaque     Comment:  history of coronary artery bypass grafting at [facility] 2007 No date: Dysrhythmia No date: Hyperlipidemia No date: Hypertension No date: Hypothyroidism No date: Presence of permanent cardiac pacemaker  Past Surgical History: No date: BACK SURGERY No date: CATARACT EXTRACTION W/ INTRAOCULAR LENS IMPLANT; Bilateral No date: CORONARY ARTERY BYPASS GRAFT No date: FRACTURE SURGERY; Left No date: HERNIA REPAIR No date: HIP FRACTURE SURGERY No date: INSERT / REPLACE / REMOVE PACEMAKER No date: open heart surgery 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  BMI    Body Mass Index:  27.46 kg/m      Reproductive/Obstetrics negative OB ROS                             Anesthesia  Physical Anesthesia Plan  ASA: III  Anesthesia Plan: Spinal   Post-op Pain Management:    Induction:   PONV Risk Score and Plan:   Airway Management Planned: Natural Airway and Nasal Cannula  Additional Equipment:   Intra-op Plan:   Post-operative Plan:   Informed Consent: I have reviewed the patients History and Physical, chart, labs and discussed the procedure including the risks, benefits and alternatives for the proposed anesthesia with the patient or authorized representative who has indicated his/her understanding and acceptance.   Dental Advisory Given  Plan Discussed with: Anesthesiologist, CRNA and Surgeon  Anesthesia Plan Comments: (Patient reports no bleeding problems and no anticoagulant use (he reports being off of coumadin for greater than 5 days and his INR is normal this AM).  Plan for spinal with backup GA  Patient consented for risks of anesthesia including but not limited to:  - adverse reactions to medications - risk of bleeding, infection, nerve damage and headache - risk of failed spinal - damage to teeth, lips or other oral mucosa - sore throat or hoarseness - Damage to heart, brain, lungs or loss of life  Patient voiced understanding.)       Anesthesia Quick Evaluation

## 2017-12-22 NOTE — Anesthesia Post-op Follow-up Note (Signed)
Anesthesia QCDR form completed.        

## 2017-12-22 NOTE — H&P (Signed)
The patient has been re-examined, and the chart reviewed, and there have been no interval changes to the documented history and physical.    The risks, benefits, and alternatives have been discussed at length. The patient expressed understanding of the risks benefits and agreed with plans for surgical intervention.  [name], Jr. M.D.    

## 2017-12-22 NOTE — Transfer of Care (Signed)
Immediate Anesthesia Transfer of Care Note  Patient: [name]  Procedure(s) Performed: COMPUTER ASSISTED TOTAL KNEE ARTHROPLASTY (Left Knee)  Patient Location: PACU  Anesthesia Type:Spinal  Level of Consciousness: sedated  Airway & Oxygen Therapy: Patient Spontanous Breathing and Patient connected to nasal cannula oxygen  Post-op Assessment: Report given to RN and Post -op Vital signs reviewed and stable  Post vital signs: Reviewed and stable  Last Vitals:  Vitals Value Taken Time  BP 70/44 12/22/2017 10:54 AM  Temp    Pulse 60 12/22/2017 10:56 AM  Resp 12 12/22/2017 10:56 AM  SpO2 95 % 12/22/2017 10:56 AM  Vitals shown include unvalidated device data.  Last Pain:  Vitals:   12/22/17 0601  TempSrc: Oral  PainSc: 7          Complications: No apparent anesthesia complications

## 2017-12-22 NOTE — Op Note (Signed)
OPERATIVE NOTE  DATE OF SURGERY:  12/22/2017  PATIENT NAME:  [name]   DOB: 1940/09/27  MRN: [id number]  PRE-OPERATIVE DIAGNOSIS: Degenerative arthrosis of the left knee, primary  POST-OPERATIVE DIAGNOSIS:  Same  PROCEDURE:  Left total knee arthroplasty using computer-assisted navigation  SURGEON:  Marciano Sequin. M.D.  ASSISTANT:  Vance Peper, PA (present and scrubbed throughout the case, critical for assistance with exposure, retraction, instrumentation, and closure)  ANESTHESIA: spinal  ESTIMATED BLOOD LOSS: 25 mL  FLUIDS REPLACED: 1100 mL of crystalloid  TOURNIQUET TIME: 92 minutes  DRAINS: 2 medium Hemovac drains  SOFT TISSUE RELEASES: Anterior cruciate ligament, posterior cruciate ligament, deep medial collateral ligament, patellofemoral ligament  IMPLANTS UTILIZED: DePuy Attune size 5 posterior stabilized femoral component (cemented), size 7 rotating platform tibial component (cemented), 41 mm medialized dome patella (cemented), and an 8 mm stabilized rotating platform polyethylene insert.  INDICATIONS FOR SURGERY: [name] is a 77 y.o. year old male with a long history of progressive knee pain. X-rays demonstrated severe degenerative changes in tricompartmental fashion. The patient had not seen any significant improvement despite conservative nonsurgical intervention. After discussion of the risks and benefits of surgical intervention, the patient expressed understanding of the risks benefits and agree with plans for total knee arthroplasty.   The risks, benefits, and alternatives were discussed at length including but not limited to the risks of infection, bleeding, nerve injury, stiffness, blood clots, the need for revision surgery, cardiopulmonary complications, among others, and they were willing to proceed.  PROCEDURE IN DETAIL: The patient was brought into the operating room and, after adequate spinal anesthesia was achieved, a tourniquet was placed on  the patient's upper thigh. The patient's knee and leg were cleaned and prepped with alcohol and DuraPrep and draped in the usual sterile fashion. A "timeout" was performed as per usual protocol. The lower extremity was exsanguinated using an Esmarch, and the tourniquet was inflated to 300 mmHg. An anterior longitudinal incision was made followed by a standard mid vastus approach. The deep fibers of the medial collateral ligament were elevated in a subperiosteal fashion off of the medial flare of the tibia so as to maintain a continuous soft tissue sleeve. The patella was subluxed laterally and the patellofemoral ligament was incised. Inspection of the knee demonstrated severe degenerative changes with full-thickness loss of articular cartilage. Osteophytes were debrided using a rongeur. Anterior and posterior cruciate ligaments were excised. Two 4.0 mm Schanz pins were inserted in the femur and into the tibia for attachment of the array of trackers used for computer-assisted navigation. Hip center was identified using a circumduction technique. Distal landmarks were mapped using the computer. The distal femur and proximal tibia were mapped using the computer. The distal femoral cutting guide was positioned using computer-assisted navigation so as to achieve a 5 distal valgus cut. The femur was sized and it was felt that a size 5 femoral component was appropriate. A size 5 femoral cutting guide was positioned and the anterior cut was performed and verified using the computer. This was followed by completion of the posterior and chamfer cuts. Femoral cutting guide for the central box was then positioned in the center box cut was performed.  Attention was then directed to the proximal tibia. Medial and lateral menisci were excised. The extramedullary tibial cutting guide was positioned using computer-assisted navigation so as to achieve a 0 varus-valgus alignment and 3 posterior slope. The cut was performed and  verified using the computer. The proximal tibia  was sized and it was felt that a size 7 tibial tray was appropriate. Tibial and femoral trials were inserted followed by insertion of an 8 mm polyethylene insert. This allowed for excellent mediolateral soft tissue balancing both in flexion and in full extension. Finally, the patella was cut and prepared so as to accommodate a 41 mm medialized dome patella. A patella trial was placed and the knee was placed through a range of motion with excellent patellar tracking appreciated. The femoral trial was removed after debridement of posterior osteophytes. The central post-hole for the tibial component was reamed followed by insertion of a keel punch. Tibial trials were then removed. Cut surfaces of bone were irrigated with copious amounts of normal saline with antibiotic solution using pulsatile lavage and then suctioned dry. Polymethylmethacrylate cement was prepared in the usual fashion using a vacuum mixer. Cement was applied to the cut surface of the proximal tibia as well as along the undersurface of a size 7 rotating platform tibial component. Tibial component was positioned and impacted into place. Excess cement was removed using [employer] fast streamer. Cement was then applied to the cut surfaces of the femur as well as along the posterior flanges of the size 5 femoral component. The femoral component was positioned and impacted into place. Excess cement was removed using [employer] fast streamer. An 8 mm polyethylene trial was inserted and the knee was brought into full extension with steady axial compression applied. Finally, cement was applied to the backside of a 41 mm medialized dome patella and the patellar component was positioned and patellar clamp applied. Excess cement was removed using [employer] fast streamer. After adequate curing of the cement, the tourniquet was deflated after a total tourniquet time of 92 minutes. Hemostasis was achieved using electrocautery. The knee was  irrigated with copious amounts of normal saline with antibiotic solution using pulsatile lavage and then suctioned dry. 20 mL of 1.3% Exparel and 60 mL of 0.25% Marcaine in 40 mL of normal saline was injected along the posterior capsule, medial and lateral gutters, and along the arthrotomy site. An 8 mm stabilized rotating platform polyethylene insert was inserted and the knee was placed through a range of motion with excellent mediolateral soft tissue balancing appreciated and excellent patellar tracking noted. 2 medium drains were placed in the wound bed and brought out through separate stab incisions. The medial parapatellar portion of the incision was reapproximated using interrupted sutures of #1 Vicryl. Subcutaneous tissue was approximated in layers using first #0 Vicryl followed #2-0 Vicryl. The skin was approximated with skin staples. A sterile dressing was applied.  The patient tolerated the procedure well and was transported to the recovery room in stable condition.    Geneen Dieter P. Holley Bouche., M.D.

## 2017-12-22 NOTE — Anesthesia Procedure Notes (Signed)
Spinal  Patient location during procedure: OR Start time: 12/22/2017 7:23 AM End time: 12/22/2017 7:34 AM Staffing Anesthesiologist: Piscitello, Precious Haws, MD Resident/CRNA: Bernardo Heater, CRNA Performed: anesthesiologist and resident/CRNA  Preanesthetic Checklist Completed: patient identified, site marked, surgical consent, pre-op evaluation, timeout performed, IV checked, risks and benefits discussed and monitors and equipment checked Spinal Block Patient position: sitting Prep: ChloraPrep Patient monitoring: heart rate, continuous pulse ox, blood pressure and cardiac monitor Approach: midline Location: L3-4 Injection technique: single-shot Needle Needle type: Whitacre and Introducer  Needle gauge: 24 G Needle length: 9 cm Assessment Sensory level: T10 Additional Notes First attempts by CRNA, final attempt by MDA. Patient reported no metal hardware in his back. Negative paresthesia. Negative blood return. Positive free-flowing CSF. Expiration date of kit checked and confirmed. Patient tolerated procedure well, without complications.

## 2017-12-23 LAB — CBC
HEMATOCRIT: 32.6 % — AB (ref 40.0–52.0)
Hemoglobin: 11.3 g/dL — ABNORMAL LOW (ref 13.0–18.0)
MCH: 31.7 pg (ref 26.0–34.0)
MCHC: 34.7 g/dL (ref 32.0–36.0)
MCV: 91.5 fL (ref 80.0–100.0)
PLATELETS: 163 10*3/uL (ref 150–440)
RBC: 3.56 MIL/uL — ABNORMAL LOW (ref 4.40–5.90)
RDW: 14.9 % — AB (ref 11.5–14.5)
WBC: 9.4 10*3/uL (ref 3.8–10.6)

## 2017-12-23 LAB — PROTIME-INR
INR: 1.06
PROTHROMBIN TIME: 13.7 s (ref 11.4–15.2)

## 2017-12-23 MED ORDER — OXYCODONE HCL 5 MG PO TABS: 5.0000 mg | ORAL_TABLET | ORAL | 0 refills | Status: DC | PRN

## 2017-12-23 MED ORDER — TRAMADOL HCL 50 MG PO TABS: 50.0000 mg | ORAL_TABLET | ORAL | 0 refills | Status: DC | PRN

## 2017-12-23 MED ORDER — WARFARIN - PHARMACIST DOSING INPATIENT: Freq: Every day | Status: DC

## 2017-12-23 NOTE — Progress Notes (Signed)
[facility] for warfarin Indication: atrial fibrillation  No Known Allergies  Patient Measurements: Height: 5\' 4"  (162.6 cm) Weight: 160 lb (72.6 kg) IBW/kg (Calculated) : 59.2   Vital Signs: Temp: 98.2 F (36.8 C) (05/14 0750) Temp Source: Oral (05/14 0750) BP: 132/62 (05/14 0750) Pulse Rate: 53 (05/14 0750)  Labs: Recent Labs    12/22/17 0618 12/23/17 0633  HGB  --  11.3*  HCT  --  32.6*  PLT  --  163  LABPROT 12.7 13.7  INR 0.96 1.06    Estimated Creatinine Clearance: 70.7 mL/min (by C-G formula based on SCr of 0.72 mg/dL).   Medical History: Past Medical History:  Diagnosis Date  . Arthritis   . Atrial fibrillation ([place])   . Cardiac pacemaker in situ   . Coronary artery disease due to lipid rich plaque    history of coronary artery bypass grafting at [facility] 2007  . Dysrhythmia   . Hyperlipidemia   . Hypertension   . Hypothyroidism   . Presence of permanent cardiac pacemaker     Medications:  Scheduled:  . amLODipine  10 mg Oral Daily  . celecoxib  200 mg Oral BID  . ferrous sulfate  325 mg Oral BID WC  . gabapentin  300 mg Oral QHS  . levothyroxine  25 mcg Oral QAC breakfast  . lisinopril  20 mg Oral Daily  . magnesium hydroxide  30 mL Oral Daily  . metoCLOPramide  10 mg Oral TID AC & HS  . metoprolol succinate  25 mg Oral Daily  . pantoprazole  40 mg Oral BID  . senna-docusate  1 tablet Oral BID  . simvastatin  20 mg Oral QPM  . sotalol  120 mg Oral BID  . tiZANidine  4 mg Oral TID  . warfarin  4 mg Oral q1800  . Warfarin - Physician Dosing Inpatient   Does not apply q1800    Assessment: Patient scheduled for procedure w/ MD [facility]. Patient has a h/o of afib anticoagulated w/ warfarin. Surgery on 12/22/17. Patient takes warfarin 4 mg daily PTA.  05/13 INR 0.96   Warfarin 4 mg 5/14  INR  1.06   Goal of Therapy:  INR 2-3 Monitor platelets by anticoagulation protocol: Yes    Plan:  Will continue patient's home dose of warfarin 4 mg daily post-op. Will continue to monitor daily CBC's and will adjust per INR trends.  Chinita Greenland PharmD Clinical Pharmacist 12/23/2017

## 2017-12-23 NOTE — Progress Notes (Signed)
Clinical Social Worker (CSW) received SNF consult. PT is recommending home health. RN case manager aware of above. Please reconsult if future social work needs arise. CSW signing off.   Ruberta Holck, LCSW [phone] 

## 2017-12-23 NOTE — Progress Notes (Signed)
OT Cancellation Note  Patient Details Name: [name] MRN: [id number] DOB: Jan 08, 1941   Cancelled Treatment:    Reason Eval/Treat Not Completed: Other (comment). Order received, chart reviewed. Pt working with PT upon attempt. Will re-attempt OT evaluation at later time as pt is available.  Jeni Salles, MPH, MS, OTR/L ascom [phone] 12/23/17, 9:51 AM

## 2017-12-23 NOTE — Evaluation (Signed)
Physical Therapy Evaluation Patient Details Name: [name] MRN: [id number] DOB: 1941/08/04 Today's Date: 12/23/2017   History of Present Illness  Pt is a 77 yo male s/p elective L TKA. PMH includes HTN, Afib, pacemaker, CAD s/p CABG 2007, back surgery, dysrythmia, and HLD.    Clinical Impression  Pt presents with min deficits in strength, transfers, mobility, gait, L knee ROM, and activity tolerance but overall performed very well during the session.  Pt required no physical assistance with bed mobility tasks, performed transfers with CGA and min verbal cues for sequencing, and ambulated with a RW 100' with reciprocal gait pattern and good stability.  Plan is to progress to stair training next session.  This patient should progress very well in acute care and will benefit from HHPT services upon discharge to safely address above deficits for decreased caregiver assistance and eventual return to PLOF.      Follow Up Recommendations Home health PT    Equipment Recommendations  None recommended by PT    Recommendations for Other Services       Precautions / Restrictions Precautions Precautions: Knee;Fall Precaution Booklet Issued: No Precaution Comments: no KI needed Required Braces or Orthoses: None Knee Immobilizer - Left: Other (comment)(Pt able to perform Ind LLE SLR without extensor lag, no KI required) Restrictions Weight Bearing Restrictions: Yes LLE Weight Bearing: Weight bearing as tolerated      Mobility  Bed Mobility Overal bed mobility: Modified Independent             General bed mobility comments: Min extra time and effort during bed mobility tasks but no physical assistance required  Transfers Overall transfer level: Needs assistance Equipment used: Rolling walker (2 wheeled) Transfers: Sit to/from Stand Sit to Stand: Min guard         General transfer comment: Min verbal cues for sequencing  Ambulation/Gait Ambulation/Gait assistance: Min  guard Ambulation Distance (Feet): 100 Feet Assistive device: Rolling walker (2 wheeled) Gait Pattern/deviations: Step-through pattern;Decreased step length - right;Decreased stance time - left Gait velocity: Decreased   General Gait Details: Step-through pattern with moderate cadence and minimally reduced stance time on the LLE but steady without LOB  Stairs            Wheelchair Mobility    Modified Rankin (Stroke Patients Only)       Balance Overall balance assessment: No apparent balance deficits (not formally assessed)                                           Pertinent Vitals/Pain Pain Assessment: 0-10 Pain Score: 2  Pain Location: L knee Pain Descriptors / Indicators: Aching;Sore Pain Intervention(s): Premedicated before session;Monitored during session    [place] expects to be discharged to:: Private residence Living Arrangements: Spouse/significant other Available Help at Discharge: Family;Available 24 hours/day Type of Home: House Home Access: Stairs to enter Entrance Stairs-Rails: Right;Left;Can reach both Entrance Stairs-Number of Steps: 4 Home Layout: One level Home Equipment: Shower seat;Bedside commode;Walker - 2 wheels;Cane - single point      Prior Function Level of Independence: Independent         Comments: Pt indep and active at baseline including amb community distances without an AD, enjoys fishing and yardwork. More recently limited due to pain. No falls in past 12 months.     Hand Dominance   Dominant Hand: Right  Extremity/Trunk Assessment   Upper Extremity Assessment Upper Extremity Assessment: Overall WFL for tasks assessed    Lower Extremity Assessment Lower Extremity Assessment: Generalized weakness;LLE deficits/detail LLE Deficits / Details: L hip flex strength 4/5 LLE: Unable to fully assess due to pain LLE Sensation: WNL    Cervical / Trunk Assessment Cervical / Trunk  Assessment: Normal  Communication   Communication: No difficulties  Cognition Arousal/Alertness: Awake/alert Behavior During Therapy: WFL for tasks assessed/performed Overall Cognitive Status: Within Functional Limits for tasks assessed                                        General Comments      Exercises Total Joint Exercises Ankle Circles/Pumps: AROM;Both;10 reps Quad Sets: Strengthening;Both;5 reps;10 reps Gluteal Sets: Strengthening;Both;10 reps Hip ABduction/ADduction: AROM;Both;10 reps Long Arc Quad: AROM;Left;10 reps;15 reps Knee Flexion: AROM;Left;10 reps;15 reps Goniometric ROM: L knee AROM: 4-78 deg Marching in Standing: AROM;Both;10 reps Other Exercises Other Exercises: HEP education and review per handout Other Exercises: 90 deg L turn training to prevent CKC twisting on the L knee   Assessment/Plan    PT Assessment Patient needs continued PT services  PT Problem List Decreased strength;Decreased range of motion;Decreased activity tolerance;Decreased mobility;Decreased knowledge of use of DME       PT Treatment Interventions DME instruction;Gait training;Stair training;Functional mobility training;Balance training;Therapeutic exercise;Therapeutic activities;Patient/family education    PT Goals (Current goals can be found in the Care Plan section)  Acute Rehab PT Goals Patient Stated Goal: To walk better and to get back to fishing PT Goal Formulation: With patient Time For Goal Achievement: 01/05/18 Potential to Achieve Goals: Good    Frequency BID   Barriers to discharge        Co-evaluation               AM-PAC PT "6 Clicks" Daily Activity  Outcome Measure Difficulty turning over in bed (including adjusting bedclothes, sheets and blankets)?: A Little Difficulty moving from lying on back to sitting on the side of the bed? : A Little Difficulty sitting down on and standing up from a chair with arms (e.g., wheelchair, bedside  commode, etc,.)?: Unable Help needed moving to and from a bed to chair (including a wheelchair)?: A Little Help needed walking in hospital room?: A Little Help needed climbing 3-5 steps with a railing? : A Little 6 Click Score: 16    End of Session Equipment Utilized During Treatment: Gait belt Activity Tolerance: Patient tolerated treatment well Patient left: in chair;with chair alarm set;with call bell/phone within reach;with family/visitor present Nurse Communication: Mobility status PT Visit Diagnosis: Other abnormalities of gait and mobility (R26.89);Muscle weakness (generalized) (M62.81)    Time: 5176-1607 PT Time Calculation (min) (ACUTE ONLY): 39 min   Charges:   PT Evaluation $PT Eval Low Complexity: 1 Low PT Treatments $Gait Training: 8-22 mins $Therapeutic Exercise: 8-22 mins   PT G Codes:        DRoyetta Asal PT, DPT 12/23/17, 1:03 PM

## 2017-12-23 NOTE — Progress Notes (Signed)
Physical Therapy Treatment Patient Details Name: [name] MRN: [id number] DOB: 07/14/1941 Today's Date: 12/23/2017    History of Present Illness Pt is a 77 yo male s/p elective L TKA. PMH includes HTN, Afib, pacemaker, CAD s/p CABG 2007, back surgery, dysrythmia, and HLD.    PT Comments    Pt presents with minor deficits in strength, transfers, mobility, gait, L knee ROM, and activity tolerance but continues to progress very well towards goals.  Pt was able to amb 200' with a RW and SBA this session with improving cadence and RLE step length/LLE stance time with good stability.  Pt ascended and descended 4 steps with B rails and CGA with good control and stability.  Pt should continue to progress very well while in acute care and will be appropriate for HHPT upon discharge to address the above deficits for return to PLOF.      Follow Up Recommendations  Home health PT     Equipment Recommendations  None recommended by PT    Recommendations for Other Services       Precautions / Restrictions Precautions Precautions: Knee;Fall Precaution Comments: No KI required, pt Ind with SLR without extensor lag Knee Immobilizer - Left: Other (comment)(Pt able to perform Ind LLE SLR without extensor lag, no KI required) Restrictions Weight Bearing Restrictions: Yes LLE Weight Bearing: Weight bearing as tolerated    Mobility  Bed Mobility Overal bed mobility: Modified Independent             General bed mobility comments: NT, pt in recliner  Transfers Overall transfer level: Needs assistance Equipment used: Rolling walker (2 wheeled) Transfers: Sit to/from Stand Sit to Stand: Supervision         General transfer comment: Min verbal cues for sequencing with good stability and control  Ambulation/Gait Ambulation/Gait assistance: Supervision Ambulation Distance (Feet): 200 Feet Assistive device: Rolling walker (2 wheeled) Gait Pattern/deviations: Step-through  pattern;Decreased step length - left;Decreased stance time - right Gait velocity: Decreased   General Gait Details: Step-through pattern with improving cadence and minimally reduced stance time on the LLE; steady without LOB   Stairs Stairs: Yes Stairs assistance: Min guard Stair Management: Two rails Number of Stairs: 4 General stair comments: Min verbal and visual cues for sequencing with very good stability ascending and descending steps   Wheelchair Mobility    Modified Rankin (Stroke Patients Only)       Balance Overall balance assessment: No apparent balance deficits (not formally assessed)                                          Cognition Arousal/Alertness: Awake/alert Behavior During Therapy: WFL for tasks assessed/performed Overall Cognitive Status: Within Functional Limits for tasks assessed                                        Exercises Total Joint Exercises Ankle Circles/Pumps: AROM;Both;10 reps Quad Sets: Strengthening;5 reps;10 reps;Left Gluteal Sets: Strengthening;Both;10 reps Hip ABduction/ADduction: AROM;Both;10 reps Long Arc Quad: AROM;Left;10 reps;15 reps Knee Flexion: AROM;Left;10 reps;15 reps Goniometric ROM: L knee AROM: 4-78 deg Marching in Standing: AROM;Both;10 reps Other Exercises Other Exercises: HEP education and review per handout Other Exercises: Positioning education and review to encourage L knee ext in bed/recliner    General Comments  Pertinent Vitals/Pain Pain Assessment: 0-10 Pain Score: 2  Pain Location: L knee Pain Descriptors / Indicators: Aching;Sore Pain Intervention(s): Premedicated before session;Monitored during session    [place] expects to be discharged to:: Private residence Living Arrangements: Spouse/significant other Available Help at Discharge: Family;Available 24 hours/day Type of Home: House Home Access: Stairs to enter Entrance  Stairs-Rails: Right;Left;Can reach both Home Layout: One level Home Equipment: Shower seat;Bedside commode;Walker - 2 wheels;Cane - single point      Prior Function Level of Independence: Independent      Comments: Pt indep and active at baseline including amb community distances without an AD, enjoys fishing and yardwork. More recently limited due to pain. No falls in past 12 months.   PT Goals (current goals can now be found in the care plan section) Acute Rehab PT Goals Patient Stated Goal: To walk better and to get back to fishing PT Goal Formulation: With patient Time For Goal Achievement: 01/05/18 Potential to Achieve Goals: Good Progress towards PT goals: Progressing toward goals    Frequency    BID      PT Plan Current plan remains appropriate    Co-evaluation              AM-PAC PT "6 Clicks" Daily Activity  Outcome Measure  Difficulty turning over in bed (including adjusting bedclothes, sheets and blankets)?: A Little Difficulty moving from lying on back to sitting on the side of the bed? : A Little Difficulty sitting down on and standing up from a chair with arms (e.g., wheelchair, bedside commode, etc,.)?: Unable Help needed moving to and from a bed to chair (including a wheelchair)?: A Little Help needed walking in hospital room?: A Little Help needed climbing 3-5 steps with a railing? : A Little 6 Click Score: 16    End of Session Equipment Utilized During Treatment: Gait belt Activity Tolerance: Patient tolerated treatment well Patient left: in chair;with chair alarm set;with call bell/phone within reach;with SCD's reapplied;Other (comment)(Polar care donned to L knee) Nurse Communication: Mobility status PT Visit Diagnosis: Other abnormalities of gait and mobility (R26.89);Muscle weakness (generalized) (M62.81)     Time: 7353-2992 PT Time Calculation (min) (ACUTE ONLY): 23 min  Charges:  $Gait Training: 8-22 mins $Therapeutic Exercise: 8-22  mins                    G Codes:       DRoyetta Asal PT, DPT 12/23/17, 4:37 PM

## 2017-12-23 NOTE — Evaluation (Signed)
Occupational Therapy Evaluation Patient Details Name: [name] MRN: [id number] DOB: 06-Dec-1940 Today's Date: 12/23/2017    History of Present Illness 77yo male POD#1 s/p L TKA. PMHx includes HTN, Afib, pacemaker, CAD s/p CABG 2007, dysrythmia, HLD.    Clinical Impression   Pt seen for OT evaluation this date, POD#1 from above surgery. Pt was independent in all ADLs prior to surgery, however limited more recently 2:2 pain. Pt is eager to return to PLOF with less pain and improved safety and independence so he can get back to fishing and yard work, both of which he enjoys. Pt currently requires minimal assist for LB dressing while in seated position due to pain and limited AROM of L knee. Pt/spouse instructed in polar care mgt, falls prevention strategies, home/routines modifications, DME/AE for LB bathing and dressing tasks, and compression stocking mgt. Pt/spouse have BSC, shower chair, reacher, SPC, and 2WW at home. Pt would benefit from skilled OT services including additional instruction in dressing techniques with or without assistive devices for dressing and bathing skills to support recall and carryover prior to discharge and ultimately to maximize safety, independence, and minimize falls risk and caregiver burden. Do not currently anticipate any OT needs following this hospitalization.      Follow Up Recommendations  No OT follow up    Equipment Recommendations  None recommended by OT    Recommendations for Other Services       Precautions / Restrictions Precautions Precautions: Knee;Fall Precaution Booklet Issued: No Precaution Comments: no KI needed Required Braces or Orthoses: Knee Immobilizer - Left Knee Immobilizer - Left: Discontinue once straight leg raise with < 10 degree lag Restrictions Weight Bearing Restrictions: Yes LLE Weight Bearing: Weight bearing as tolerated      Mobility Bed Mobility               General bed mobility comments: deferred, up  in recliner  Transfers Overall transfer level: Needs assistance Equipment used: Rolling walker (2 wheeled) Transfers: Sit to/from Stand Sit to Stand: Min guard              Balance Overall balance assessment: No apparent balance deficits (not formally assessed)                                         ADL either performed or assessed with clinical judgement   ADL Overall ADL's : Needs assistance/impaired Eating/Feeding: Sitting;Independent   Grooming: Sitting;Independent   Upper Body Bathing: Sitting;Supervision/ safety   Lower Body Bathing: Sit to/from stand;Minimal assistance;With caregiver independent assisting   Upper Body Dressing : Sitting;Supervision/safety   Lower Body Dressing: Sit to/from stand;Minimal assistance;With caregiver independent assisting Lower Body Dressing Details (indicate cue type and reason): pt/spouse instructed in AE for LB dressing and compression stocking mgt strategies  Toilet Transfer: RW;Min guard;Ambulation;Comfort height toilet Toilet Transfer Details (indicate cue type and reason): instructed pt to use BSC frame over commode at home to improve safety and independence with toilet transfers, suggested using urinal overnight beside bed to decrease risk of falls (pt gets up 3-4x/night)         Functional mobility during ADLs: Min guard;Rolling walker       Vision Baseline Vision/History: Wears glasses Wears Glasses: Reading only Patient Visual Report: No change from baseline       Perception     Praxis      Pertinent Vitals/Pain Pain Assessment:  0-10 Pain Score: 4  Pain Location: L knee Pain Descriptors / Indicators: Aching Pain Intervention(s): Limited activity within patient's tolerance;Monitored during session;Premedicated before session;Ice applied     Hand Dominance     Extremity/Trunk Assessment Upper Extremity Assessment Upper Extremity Assessment: Overall WFL for tasks assessed   Lower Extremity  Assessment Lower Extremity Assessment: Defer to PT evaluation(anticipated post-op strength/ROM deficits in L knee)   Cervical / Trunk Assessment Cervical / Trunk Assessment: Normal   Communication Communication Communication: No difficulties   Cognition Arousal/Alertness: Awake/alert Behavior During Therapy: WFL for tasks assessed/performed Overall Cognitive Status: Within Functional Limits for tasks assessed                                     General Comments       Exercises Other Exercises Other Exercises: Pt/spouse instructed in polar care mgt Other Exercises: Pt/spouse instructed in home/routines modifications and falls prevention strategies to maximize safety   Shoulder Instructions      Home Living Family/patient expects to be discharged to:: Private residence Living Arrangements: Spouse/significant other Available Help at Discharge: Family;Available 24 hours/day Type of Home: House Home Access: Stairs to enter [employer] of Steps: 4 Entrance Stairs-Rails: Right;Left;Can reach both Home Layout: One level     Bathroom Shower/Tub: Occupational psychologist: Standard     Home Equipment: Shower seat;Bedside commode;Walker - 2 wheels;Cane - single point;Adaptive equipment Adaptive Equipment: Reacher        Prior Functioning/Environment Level of Independence: Independent        Comments: Pt indep and active at baseline, enjoys fishing and yardwork. More recently limited 2:2 pain. No falls in past 12 months.        OT Problem List: Decreased knowledge of use of DME or AE;Decreased range of motion;Pain      OT Treatment/Interventions: Self-care/ADL training;Therapeutic exercise;Therapeutic activities;DME and/or AE instruction;Patient/family education    OT Goals(Current goals can be found in the care plan section) Acute Rehab OT Goals Patient Stated Goal: return to PLOF with less pain so he can go fishing again OT Goal  Formulation: With patient/family Time For Goal Achievement: 01/06/18 Potential to Achieve Goals: Good ADL Goals Pt Will Perform Lower Body Dressing: with adaptive equipment;with supervision;sit to/from stand Pt Will Transfer to Toilet: with supervision;ambulating(BSC frame over commode, RW for amb)  OT Frequency: Min 1X/week   Barriers to D/C:            Co-evaluation              AM-PAC PT "6 Clicks" Daily Activity     Outcome Measure Help from another person eating meals?: None Help from another person taking care of personal grooming?: None Help from another person toileting, which includes using toliet, bedpan, or urinal?: A Little Help from another person bathing (including washing, rinsing, drying)?: A Little Help from another person to put on and taking off regular upper body clothing?: None Help from another person to put on and taking off regular lower body clothing?: A Little 6 Click Score: 21   End of Session Equipment Utilized During Treatment: Rolling walker;Gait belt  Activity Tolerance: Patient tolerated treatment well Patient left: in chair;with call bell/phone within reach;with chair alarm set;with family/visitor present;with SCD's reapplied;Other (comment)(polar care in place)  OT Visit Diagnosis: Other abnormalities of gait and mobility (R26.89);Pain Pain - Right/Left: Left Pain - part of body: Knee  Time: 9179-2178 OT Time Calculation (min): 28 min Charges:  OT General Charges $OT Visit: 1 Visit OT Evaluation $OT Eval Low Complexity: 1 Low OT Treatments $Self Care/Home Management : 23-37 mins  Jeni Salles, MPH, MS, OTR/L ascom [phone] 12/23/17, 11:25 AM

## 2017-12-23 NOTE — NC FL2 (Signed)
[place] LEVEL OF CARE SCREENING TOOL     IDENTIFICATION  Patient Name: [name] Birthdate: May 16, 1941 Sex: male Admission Date (Current Location): 12/22/2017  [place] and [place] Number:  Engineering geologist and Address:  [facility], [address] [place], [place] [address]      Provider Number: [id number]  Attending Physician Name and Address:  Dereck Leep, MD  Relative Name and Phone Number:       Current Level of Care: Hospital Recommended Level of Care: [place] Prior Approval Number:    Date Approved/Denied:   PASRR Number: [id number])  Discharge Plan: SNF    Current Diagnoses: Patient Active Problem List   Diagnosis Date Noted  . Anticoagulation goal of INR 2 to 3 12/22/2017  . 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 Bradycardia 12/22/2017  . High risk medication use 12/22/2017  . Stroke ([place]) 12/22/2017  . 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 Primary osteoarthritis of left knee 10/26/2017  . Varicose veins of left lower extremity with pain 09/18/2017  . Peripheral arterial disease ([place]) 07/12/2015  . Coronary artery disease due to lipid rich plaque 07/12/2015  . Atrial fibrillation ([place]) 07/12/2015  . Cardiac pacemaker in situ 07/12/2015  . Essential hypertension 07/12/2015  . Hyperlipidemia 07/12/2015  . Nuclear cataract 08/03/2012  . Pterygium 08/03/2012  . S/P CABG (coronary artery bypass graft) 08/21/2004    Orientation RESPIRATION BLADDER Height & Weight     Self, Time, Situation, Place  O2(1 Liter Oxygen. ) Continent Weight: 160 lb (72.6 kg) Height:  5\' 4"  (162.6 cm)  BEHAVIORAL SYMPTOMS/MOOD NEUROLOGICAL BOWEL NUTRITION STATUS      Continent Diet(Diet: Regular )  AMBULATORY STATUS COMMUNICATION OF NEEDS Skin   Extensive Assist Verbally Surgical wounds(Incision: Left Knee. )                       Personal Care Assistance Level of Assistance  Bathing, Feeding, Dressing  Bathing Assistance: Limited assistance Feeding assistance: Independent Dressing Assistance: Limited assistance     Functional Limitations Info  Sight, Hearing, Speech Sight Info: Adequate Hearing Info: Adequate Speech Info: Adequate    SPECIAL CARE FACTORS FREQUENCY  PT (By licensed PT), OT (By licensed OT)     PT Frequency: (5) OT Frequency: (5)            Contractures      Additional Factors Info  Code Status, Allergies Code Status Info: (Full Code. ) Allergies Info: (No Known Allergies. )           Current Medications (12/23/2017):  This is the current hospital active medication list Current Facility-Administered Medications  Medication Dose Route Frequency Provider Last Rate Last Dose  . 0.9 %  sodium chloride infusion   Intravenous Continuous Hooten, Laurice Record, MD 100 mL/hr at 12/23/17 0503    . acetaminophen (OFIRMEV) IV 1,000 mg  1,000 mg Intravenous Q6H Hooten, Laurice Record, MD   Stopped at 12/23/17 0226  . acetaminophen (TYLENOL) tablet 325-650 mg  325-650 mg Oral Q6H PRN Hooten, Laurice Record, MD      . alum & mag hydroxide-simeth (MAALOX/MYLANTA) 200-200-20 MG/5ML suspension 30 mL  30 mL Oral Q4H PRN Hooten, Laurice Record, MD      . amLODipine (NORVASC) tablet 10 mg  10 mg Oral Daily Hooten, Laurice Record, MD      . bisacodyl (DULCOLAX) suppository 10 mg  10 mg Rectal Daily PRN Hooten, Laurice Record, MD      .  ceFAZolin (ANCEF) IVPB 2g/100 mL premix  2 g Intravenous Q6H Hooten, Laurice Record, MD   Stopped at 12/23/17 0201  . celecoxib (CELEBREX) capsule 200 mg  200 mg Oral BID Dereck Leep, MD   200 mg at 12/22/17 2122  . diphenhydrAMINE (BENADRYL) 12.5 MG/5ML elixir 12.5-25 mg  12.5-25 mg Oral Q4H PRN Hooten, Laurice Record, MD      . ferrous sulfate tablet 325 mg  325 mg Oral BID WC Hooten, Laurice Record, MD   325 mg at 12/22/17 1723  . fluorouracil (EFUDEX) 5 % cream 1 application  1 application Topical BID PRN Hooten, Laurice Record, MD      . gabapentin (NEURONTIN) capsule 300 mg  300 mg Oral QHS Hooten,  Laurice Record, MD   300 mg at 12/22/17 2122  . HYDROmorphone (DILAUDID) injection 0.5-1 mg  0.5-1 mg Intravenous Q4H PRN Hooten, Laurice Record, MD   0.5 mg at 12/22/17 1324  . levothyroxine (SYNTHROID, LEVOTHROID) tablet 25 mcg  25 mcg Oral QAC breakfast Hooten, Laurice Record, MD      . lisinopril (PRINIVIL,ZESTRIL) tablet 20 mg  20 mg Oral Daily Hooten, Laurice Record, MD      . magnesium hydroxide (MILK OF MAGNESIA) suspension 30 mL  30 mL Oral Daily Hooten, Laurice Record, MD   30 mL at 12/22/17 1506  . menthol-cetylpyridinium (CEPACOL) lozenge 3 mg  1 lozenge Oral PRN Hooten, Laurice Record, MD       Or  . phenol (CHLORASEPTIC) mouth spray 1 spray  1 spray Mouth/Throat PRN Hooten, Laurice Record, MD      . metoCLOPramide (REGLAN) tablet 5-10 mg  5-10 mg Oral Q8H PRN Hooten, Laurice Record, MD       Or  . metoCLOPramide (REGLAN) injection 5-10 mg  5-10 mg Intravenous Q8H PRN Hooten, Laurice Record, MD      . metoCLOPramide (REGLAN) tablet 10 mg  10 mg Oral TID AC & HS Hooten, Laurice Record, MD   10 mg at 12/22/17 2122  . metoprolol succinate (TOPROL-XL) 24 hr tablet 25 mg  25 mg Oral Daily Hooten, Laurice Record, MD      . ondansetron (ZOFRAN) tablet 4 mg  4 mg Oral Q6H PRN Hooten, Laurice Record, MD       Or  . ondansetron (ZOFRAN) injection 4 mg  4 mg Intravenous Q6H PRN Hooten, Laurice Record, MD      . oxyCODONE (Oxy IR/ROXICODONE) immediate release tablet 10 mg  10 mg Oral Q4H PRN Dereck Leep, MD   10 mg at 12/22/17 2154  . oxyCODONE (Oxy IR/ROXICODONE) immediate release tablet 5 mg  5 mg Oral Q4H PRN Hooten, Laurice Record, MD      . pantoprazole (PROTONIX) EC tablet 40 mg  40 mg Oral BID Dereck Leep, MD   40 mg at 12/22/17 2122  . senna-docusate (Senokot-S) tablet 1 tablet  1 tablet Oral BID Dereck Leep, MD   1 tablet at 12/22/17 2122  . simvastatin (ZOCOR) tablet 20 mg  20 mg Oral QPM Hooten, Laurice Record, MD   20 mg at 12/22/17 1723  . sodium phosphate (FLEET) 7-19 GM/118ML enema 1 enema  1 enema Rectal Once PRN Hooten, Laurice Record, MD      . sotalol (BETAPACE) tablet 120  mg  120 mg Oral BID Dereck Leep, MD   120 mg at 12/22/17 2123  . tiZANidine (ZANAFLEX) tablet 4 mg  4 mg Oral TID Hooten, Laurice Record, MD  4 mg at 12/22/17 2124  . traMADol (ULTRAM) tablet 50-100 mg  50-100 mg Oral Q4H PRN Hooten, Laurice Record, MD      . warfarin (COUMADIN) tablet 4 mg  4 mg Oral q1800 Hooten, Laurice Record, MD   4 mg at 12/22/17 1724  . Warfarin - Physician Dosing Inpatient   Does not apply q1800 Hooten, Laurice Record, MD         Discharge Medications: Please see discharge summary for a list of discharge medications.  Relevant Imaging Results:  Relevant Lab Results:   Additional Information (SSN: [id number])  Kaisei Gilbo, Veronia Beets, LCSW

## 2017-12-23 NOTE — Progress Notes (Signed)
   Subjective: 1 Day Post-Op Procedure(s) (LRB): COMPUTER ASSISTED TOTAL KNEE ARTHROPLASTY (Left) Patient reports pain as 0 on 0-10 scale.   Patient is well, and has had no acute complaints or problems We will start therapy today.  Plan is to go Home after hospital stay. no nausea and no vomiting Patient denies any chest pains or shortness of breath. Patient done extremely well during the night.  This is very pleased with results.  Patient states that he had one episode of pain that was controlled with oral medication.  Objective: Vital signs in last 24 hours: Temp:  [97.5 F (36.4 C)-98.5 F (36.9 C)] 98.3 F (36.8 C) (05/14 0337) Pulse Rate:  [59-72] 60 (05/14 0337) Resp:  [15-20] 18 (05/14 0337) BP: (70-162)/(44-95) 106/66 (05/14 0337) SpO2:  [94 %-100 %] 98 % (05/14 0337) Heels are non tender and elevated off the bed using rolled towels Intake/Output from previous day: 05/13 0701 - 05/14 0700 In: 2708.3 [P.O.:240; I.V.:2088.3; IV Piggyback:310] Out: 4562 [Urine:3025; Drains:140; Blood:25] Intake/Output this shift: No intake/output data recorded.  Recent Labs    12/23/17 0633  HGB 11.3*   Recent Labs    12/23/17 0633  WBC 9.4  RBC 3.56*  HCT 32.6*  PLT 163   No results for input(s): NA, K, CL, CO2, BUN, CREATININE, GLUCOSE, CALCIUM in the last 72 hours. Recent Labs    12/22/17 0618 12/23/17 0633  INR 0.96 1.06    EXAM General - Patient is Alert, Appropriate and Oriented Extremity - Neurologically intact Neurovascular intact Sensation intact distally Intact pulses distally Dorsiflexion/Plantar flexion intact Compartment soft Dressing - dressing C/D/I Motor Function - intact, moving foot and toes well on exam.    Past Medical History:  Diagnosis Date  . Arthritis   . Atrial fibrillation ([place])   . Cardiac pacemaker in situ   . Coronary artery disease due to lipid rich plaque    history of coronary artery bypass grafting at [facility] 2007  . Dysrhythmia   . Hyperlipidemia   . Hypertension   . Hypothyroidism   . Presence of permanent cardiac pacemaker     Assessment/Plan: 1 Day Post-Op Procedure(s) (LRB): COMPUTER ASSISTED TOTAL KNEE ARTHROPLASTY (Left) Active Problems:   S/P total knee arthroplasty  Estimated body mass index is 27.46 kg/m as calculated from the following:   Height as of this encounter: 5\' 4"  (1.626 m).   Weight as of this encounter: 72.6 kg (160 lb). Advance diet Up with therapy D/C IV fluids Plan for discharge tomorrow Discharge home with home health  Labs: INR 1.06 DVT Prophylaxis - Coumadin, Foot Pumps and TED hose Weight-Bearing as tolerated to left leg D/C O2 and Pulse OX and try on Room Air Begin working on bowel movement  [name] 12/23/2017, 7:38 AM

## 2017-12-23 NOTE — Anesthesia Postprocedure Evaluation (Signed)
Anesthesia Post Note  Patient: [name]  Procedure(s) Performed: COMPUTER ASSISTED TOTAL KNEE ARTHROPLASTY (Left Knee)  Patient location during evaluation: Nursing Unit Anesthesia Type: Spinal Level of consciousness: awake, awake and alert and oriented Pain management: pain level controlled Vital Signs Assessment: post-procedure vital signs reviewed and stable Respiratory status: spontaneous breathing, nonlabored ventilation and respiratory function stable Cardiovascular status: stable Postop Assessment: no headache, no backache, patient able to bend at knees, no apparent nausea or vomiting and adequate PO intake Anesthetic complications: no     Last Vitals:  Vitals:   12/23/17 0337 12/23/17 0750  BP: 106/66 132/62  Pulse: 60 (!) 53  Resp: 18   Temp: 36.8 C 36.8 C  SpO2: 98% 97%    Last Pain:  Vitals:   12/23/17 0750  TempSrc: Oral  PainSc:                  Ricki Miller

## 2017-12-23 NOTE — Discharge Summary (Signed)
Physician Discharge Summary  Patient ID: [name] MRN: [id number] DOB/AGE: 12-29-1940 77 y.o.  Admit date: 12/22/2017 Discharge date: 12/24/2017  Admission Diagnoses:  P   Discharge Diagnoses: Patient Active Problem List   Diagnosis Date Noted  . Anticoagulation goal of INR 2 to 3 12/22/2017  . 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 Bradycardia 12/22/2017  . High risk medication use 12/22/2017  . Stroke ([place]) 12/22/2017  . 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 Primary osteoarthritis of left knee 10/26/2017  . Varicose veins of left lower extremity with pain 09/18/2017  . Peripheral arterial disease ([place]) 07/12/2015  . Coronary artery disease due to lipid rich plaque 07/12/2015  . Atrial fibrillation ([place]) 07/12/2015  . Cardiac pacemaker in situ 07/12/2015  . Essential hypertension 07/12/2015  . Hyperlipidemia 07/12/2015  . Nuclear cataract 08/03/2012  . Pterygium 08/03/2012  . S/P CABG (coronary artery bypass graft) 08/21/2004    Past Medical History:  Diagnosis Date  . Arthritis   . Atrial fibrillation ([place])   . Cardiac pacemaker in situ   . Coronary artery disease due to lipid rich plaque    history of coronary artery bypass grafting at [facility] 2007  . Dysrhythmia   . Hyperlipidemia   . Hypertension   . Hypothyroidism   . Presence of permanent cardiac pacemaker      Transfusion: No transfusions during this admission   Consultants (if any):   Discharged Condition: Improved  Hospital Course: [name] is an 77 y.o. male who was admitted 12/22/2017 with a diagnosis of degenerative arthrosis right knee and went to the operating room on 12/22/2017 and underwent the above named procedures.    Surgeries:Procedure(s): COMPUTER ASSISTED TOTAL KNEE ARTHROPLASTY on 12/22/2017  PRE-OPERATIVE DIAGNOSIS: Degenerative arthrosis of the left knee, primary  POST-OPERATIVE DIAGNOSIS:  Same  PROCEDURE:  Left total knee arthroplasty using  computer-assisted navigation  SURGEON:  Marciano Sequin. M.D.  ASSISTANT:  Vance Peper, PA (present and scrubbed throughout the case, critical for assistance with exposure, retraction, instrumentation, and closure)  ANESTHESIA: spinal  ESTIMATED BLOOD LOSS: 25 mL  FLUIDS REPLACED: 1100 mL of crystalloid  TOURNIQUET TIME: 92 minutes  DRAINS: 2 medium Hemovac drains  SOFT TISSUE RELEASES: Anterior cruciate ligament, posterior cruciate ligament, deep medial collateral ligament, patellofemoral ligament  IMPLANTS UTILIZED: DePuy Attune size 5 posterior stabilized femoral component (cemented), size 7 rotating platform tibial component (cemented), 41 mm medialized dome patella (cemented), and an 8 mm stabilized rotating platform polyethylene insert.  INDICATIONS FOR SURGERY: [name] is a 77 y.o. year old male with a long history of progressive knee pain. X-rays demonstrated severe degenerative changes in tricompartmental fashion. The patient had not seen any significant improvement despite conservative nonsurgical intervention. After discussion of the risks and benefits of surgical intervention, the patient expressed understanding of the risks benefits and agree with plans for total knee arthroplasty.   The risks, benefits, and alternatives were discussed at length including but not limited to the risks of infection, bleeding, nerve injury, stiffness, blood clots, the need for revision surgery, cardiopulmonary complications, among others, and they were willing to proceed.   Patient tolerated the surgery well. No complications .Patient was taken to PACU where she was stabilized and then transferred to the orthopedic floor.  Patient started on Coumadin 4 mg every 24 hours. Foot pumps applied bilaterally at 80 mm hgb. Heels elevated off bed with rolled towels. No evidence of DVT. Calves non tender. Negative Homan. Physical therapy  started on day #1 for gait training and transfer  with OT starting on  day #1 for ADL and assisted devices. Patient has done well with therapy. Ambulated greater than 200 feet upon being discharged.  Was able to ascend and descend 4 steps safely and independently  Patient's IV And Foley were discontinued on day #1 with Hemovac being discontinued on day #2. Dressing was changed on day 2 prior to patient being discharged   He was given perioperative antibiotics:  Anti-infectives (From admission, onward)   Start     Dose/Rate Route Frequency Ordered Stop   12/22/17 1400  ceFAZolin (ANCEF) IVPB 2g/100 mL premix     2 g 200 mL/hr over 30 Minutes Intravenous Every 6 hours 12/22/17 1232 12/23/17 1359   12/22/17 0600  ceFAZolin (ANCEF) IVPB 2g/100 mL premix     2 g 200 mL/hr over 30 Minutes Intravenous On call to O.R. 12/21/17 2302 12/22/17 0757   12/22/17 0550  ceFAZolin (ANCEF) 2-4 GM/100ML-% IVPB    Note to Pharmacy:  Jeanene Erb   : cabinet override      12/22/17 0550 12/22/17 0745    .  He was fitted with AV 1 compression foot pump devices, instructed on heel pumps, early ambulation, and fitted with TED stockings bilaterally for DVT prophylaxis.  He benefited maximally from the hospital stay and there were no complications.    Recent vital signs:  Vitals:   12/23/17 0029 12/23/17 0337  BP: (!) 110/54 106/66  Pulse: 60 60  Resp: 18 18  Temp: 97.7 F (36.5 C) 98.3 F (36.8 C)  SpO2: 99% 98%    Recent laboratory studies:  Lab Results  Component Value Date   HGB 11.3 (L) 12/23/2017   HGB 14.6 12/10/2017   Lab Results  Component Value Date   WBC 9.4 12/23/2017   PLT 163 12/23/2017   Lab Results  Component Value Date   INR 1.06 12/23/2017   Lab Results  Component Value Date   NA 138 12/10/2017   K 3.8 12/10/2017   CL 101 12/10/2017   CO2 28 12/10/2017   BUN 17 12/10/2017   CREATININE 0.72 12/10/2017   GLUCOSE 93 12/10/2017    Discharge Medications:   Allergies as of 12/23/2017   No Known Allergies      Medication List    STOP taking these medications   oxyCODONE-acetaminophen 10-325 MG tablet Commonly known as:  PERCOCET     TAKE these medications   amLODipine 10 MG tablet Commonly known as:  NORVASC Take 10 mg by mouth daily.   fluorouracil 5 % cream Commonly known as:  EFUDEX Apply 1 application topically 2 (two) times daily as needed for skin breakdown.   levothyroxine 25 MCG tablet Commonly known as:  SYNTHROID, LEVOTHROID Take 25 mcg by mouth daily before breakfast.   lisinopril 20 MG tablet Commonly known as:  PRINIVIL,ZESTRIL Take 20 mg by mouth daily.   metoprolol succinate 25 MG 24 hr tablet Commonly known as:  TOPROL-XL Take 25 mg by mouth daily.   mupirocin cream 2 % Commonly known as:  BACTROBAN Apply 1 application topically 2 (two) times daily. What changed:    when to take this  reasons to take this   omeprazole 40 MG capsule Commonly known as:  PRILOSEC Take 40 mg by mouth daily as needed (for heartburn/indigestion).   oxyCODONE 5 MG immediate release tablet Commonly known as:  Oxy IR/ROXICODONE Take 1 tablet (5 mg total) by mouth every 4 (  four) hours as needed for moderate pain (pain score 4-6).   simvastatin 20 MG tablet Commonly known as:  ZOCOR Take 20 mg by mouth every evening.   sotalol 120 MG tablet Commonly known as:  BETAPACE Take 120 mg by mouth 2 (two) times daily.   tiZANidine 4 MG tablet Commonly known as:  ZANAFLEX Take 1 tablet by mouth 3 (three) times daily.   traMADol 50 MG tablet Commonly known as:  ULTRAM Take 1-2 tablets (50-100 mg total) by mouth every 4 (four) hours as needed for moderate pain.   warfarin 4 MG tablet Commonly known as:  COUMADIN Take 4 mg by mouth daily.            Durable Medical Equipment  (From admission, onward)        Start     Ordered   12/22/17 1232  DME Walker rolling  Once    Question:  Patient needs a walker to treat with the following condition  Answer:  Total knee  replacement status   12/22/17 1232   12/22/17 1232  DME Bedside commode  Once    Question:  Patient needs a bedside commode to treat with the following condition  Answer:  Total knee replacement status   12/22/17 1232      Diagnostic Studies: Dg Knee Left Port  Result Date: 12/22/2017 CLINICAL DATA:  Status post left total knee joint prosthesis placement. EXAM: PORTABLE LEFT KNEE - 1-2 VIEW COMPARISON:  None in PACs FINDINGS: The prosthesis is in reasonable position. The interface with the native bone is normal. No native bone abnormality is observed. Surgical drain lines and skin staples are present. Cortical screw tunnels are present in the proximal diaphysis of the tibia and distal femur. A bony chip lies adjacent to the lateral cortex of the proximal tibia at the level of the lower cortical tunnel. IMPRESSION: No immediate complication following left knee joint prosthesis placement. Electronically Signed   By: David  Martinique M.D.   On: 12/22/2017 11:18    Disposition:   Discharge Instructions    Increase activity slowly   Complete by:  As directed       Follow-up Information    Watt Climes, PA On 01/06/2018.   Specialty:  Physician Assistant Why:  at 1:15pm Contact information: [place] [address] [phone]        Dereck Leep, MD On 02/03/2018.   Specialty:  Orthopedic Surgery Why:  at 10:45am Contact information: [place] [address] [phone]            Signed: Watt Climes 12/23/2017, 7:43 AM

## 2017-12-24 LAB — CBC
HEMATOCRIT: 30.6 % — AB (ref 40.0–52.0)
Hemoglobin: 10.4 g/dL — ABNORMAL LOW (ref 13.0–18.0)
MCH: 31.4 pg (ref 26.0–34.0)
MCHC: 34 g/dL (ref 32.0–36.0)
MCV: 92.3 fL (ref 80.0–100.0)
Platelets: 150 10*3/uL (ref 150–440)
RBC: 3.31 MIL/uL — ABNORMAL LOW (ref 4.40–5.90)
RDW: 14.9 % — AB (ref 11.5–14.5)
WBC: 6.6 10*3/uL (ref 3.8–10.6)

## 2017-12-24 LAB — PROTIME-INR
INR: 1.21
Prothrombin Time: 15.2 seconds (ref 11.4–15.2)

## 2017-12-24 NOTE — Progress Notes (Signed)
[facility] Next of kin: Twanna Hy [phone] (son)  If patient discharges after hours, please call [phone].   [place] 12/24/2017, 11:14 AM

## 2017-12-24 NOTE — Progress Notes (Signed)
Physical Therapy Treatment Patient Details Name: [name] MRN: [id number] DOB: August 23, 1940 Today's Date: 12/24/2017    History of Present Illness Pt is a 77 yo male s/p elective L TKA. PMH includes HTN, Afib, pacemaker, CAD s/p CABG 2007, back surgery, dysrythmia, and HLD.    PT Comments    Pt presents with minor deficits in strength, transfers, mobility, gait, L knee ROM, and activity tolerance but continues to make excellent progress towards goals.  Pt SBA with good stability with both amb and stairs.  Pt presented with continued progress towards quality of gate including increased cadence and step length and demonstrated fair to good carryover with sequencing during 90 deg L turns to prevent L knee CKC twisting as well as proper sequencing on stairs.  Pt will benefit from HHPT services upon discharge to safely address above deficits for return to PLOF.       Follow Up Recommendations  Home health PT     Equipment Recommendations  None recommended by PT    Recommendations for Other Services       Precautions / Restrictions Precautions Precautions: Knee;Fall Precaution Comments: No KI required, pt Ind with SLR without extensor lag Restrictions Weight Bearing Restrictions: Yes LLE Weight Bearing: Weight bearing as tolerated    Mobility  Bed Mobility               General bed mobility comments: NT, pt in recliner  Transfers                    Ambulation/Gait Ambulation/Gait assistance: Supervision Ambulation Distance (Feet): 200 Feet x 1, 150 Feet x 1 Assistive device: Rolling walker (2 wheeled) Gait Pattern/deviations: Step-through pattern;Decreased step length - left;Decreased stance time - right Gait velocity: Decreased   General Gait Details: Step-through pattern with improving cadence and minimally reduced stance time on the LLE; steady without LOB; practiced 90 deg L turns to prevent CKC twisting on the L knee with fair carryover   Stairs    Stairs assistance: Supervision Stair Management: Two rails Number of Stairs: 4 General stair comments: Min verbal and visual cues for sequencing with very good stability ascending and descending steps   Wheelchair Mobility    Modified Rankin (Stroke Patients Only)       Balance Overall balance assessment: No apparent balance deficits (not formally assessed)                                          Cognition Arousal/Alertness: Awake/alert Behavior During Therapy: WFL for tasks assessed/performed Overall Cognitive Status: Within Functional Limits for tasks assessed                                        Exercises Total Joint Exercises Ankle Circles/Pumps: AROM;Both;10 reps Quad Sets: Strengthening;5 reps;10 reps;Left Gluteal Sets: Strengthening;Both;10 reps Long Arc Quad: AROM;Left;10 reps;15 reps Knee Flexion: AROM;Left;10 reps;15 reps Goniometric ROM: L knee AROM: 4-82 deg Marching in Standing: AROM;Both;10 reps Other Exercises Other Exercises: HEP education and review per handout    General Comments        Pertinent Vitals/Pain Pain Assessment: 0-10 Pain Score: 2  Pain Location: L knee Pain Descriptors / Indicators: Aching;Sore Pain Intervention(s): Premedicated before session;Monitored during session    Home Living  Prior Function            PT Goals (current goals can now be found in the care plan section) Progress towards PT goals: Progressing toward goals    Frequency    BID      PT Plan Current plan remains appropriate    Co-evaluation              AM-PAC PT "6 Clicks" Daily Activity  Outcome Measure                   End of Session Equipment Utilized During Treatment: Gait belt Activity Tolerance: Patient tolerated treatment well Patient left: in chair;with chair alarm set;with call bell/phone within reach;with SCD's reapplied;Other (comment)(Polar care to L  knee) Nurse Communication: Mobility status PT Visit Diagnosis: Other abnormalities of gait and mobility (R26.89);Muscle weakness (generalized) (M62.81)     Time: 9379-0240 PT Time Calculation (min) (ACUTE ONLY): 27 min  Charges:  $Gait Training: 8-22 mins $Therapeutic Exercise: 8-22 mins                    G Codes:       DRoyetta Asal PT, DPT 12/24/17, 11:47 AM

## 2017-12-24 NOTE — Progress Notes (Addendum)
ANTICOAGULATION CONSULT NOTE  Pharmacy Consult for warfarin Indication: atrial fibrillation  No Known Allergies  Patient Measurements: Height: 5\' 4"  (162.6 cm) Weight: 160 lb (72.6 kg) IBW/kg (Calculated) : 59.2   Vital Signs: Temp: 98 F (36.7 C) (05/15 0355) Temp Source: Oral (05/15 0355) BP: 120/67 (05/15 0355) Pulse Rate: 63 (05/15 0355)  Labs: Recent Labs    12/22/17 0618 12/23/17 9163 12/24/17 0421  HGB  --  11.3* 10.4*  HCT  --  32.6* 30.6*  PLT  --  163 150  LABPROT 12.7 13.7 15.2  INR 0.96 1.06 1.21    Estimated Creatinine Clearance: 70.7 mL/min (by C-G formula based on SCr of 0.72 mg/dL).   Medical History: Past Medical History:  Diagnosis Date  . Arthritis   . Atrial fibrillation ([place])   . Cardiac pacemaker in situ   . Coronary artery disease due to lipid rich plaque    history of coronary artery bypass grafting at [facility] 2007  . Dysrhythmia   . Hyperlipidemia   . Hypertension   . Hypothyroidism   . Presence of permanent cardiac pacemaker     Medications:  Scheduled:  . amLODipine  10 mg Oral Daily  . celecoxib  200 mg Oral BID  . ferrous sulfate  325 mg Oral BID WC  . gabapentin  300 mg Oral QHS  . levothyroxine  25 mcg Oral QAC breakfast  . lisinopril  20 mg Oral Daily  . magnesium hydroxide  30 mL Oral Daily  . metoCLOPramide  10 mg Oral TID AC & HS  . metoprolol succinate  25 mg Oral Daily  . pantoprazole  40 mg Oral BID  . senna-docusate  1 tablet Oral BID  . simvastatin  20 mg Oral QPM  . sotalol  120 mg Oral BID  . tiZANidine  4 mg Oral TID  . warfarin  4 mg Oral q1800  . Warfarin - Pharmacist Dosing Inpatient   Does not apply q1800    Assessment: Patient scheduled for procedure w/ MD Hooten. Patient has a h/o of afib anticoagulated w/ warfarin. Surgery on 12/22/17. Patient takes warfarin 4 mg daily PTA.  05/13 INR 0.96   Warfarin 4 mg 5/14  INR  1.06   Warfarin 4 mg 5/15  INR  1.21   Goal of  Therapy:  INR 2-3 Monitor platelets by anticoagulation protocol: Yes   Plan:  Will continue Warfrin 4 mg daily. If INR not therapeutic tomorrow would consider increased one time dose. Will continue to monitor daily CBC's and will adjust per INR trends.  Chinita Greenland PharmD Clinical Pharmacist 12/24/2017

## 2017-12-24 NOTE — Progress Notes (Signed)
   Subjective: 2 Days Post-Op Procedure(s) (LRB): COMPUTER ASSISTED TOTAL KNEE ARTHROPLASTY (Left) Patient reports pain as mild.   Patient is well, and has had no acute complaints or problems Patient did extremely well he yesterday with physical therapy and met goals to go home. Plan is to go Home after hospital stay. no nausea and no vomiting Patient denies any chest pains or shortness of breath. Objective: Vital signs in last 24 hours: Temp:  [97.6 F (36.4 C)-98.2 F (36.8 C)] 98 F (36.7 C) (05/15 0355) Pulse Rate:  [53-70] 63 (05/15 0355) Resp:  [18-20] 19 (05/15 0355) BP: (98-133)/(57-80) 120/67 (05/15 0355) SpO2:  [95 %-99 %] 99 % (05/15 0355) well approximated incision Heels are non tender and elevated off the bed using rolled towels Intake/Output from previous day: 05/14 0701 - 05/15 0700 In: 1180 [P.O.:480; I.V.:700] Out: 2090 [Urine:1720; Drains:370] Intake/Output this shift: Total I/O In: -  Out: 1290 [Urine:1200; Drains:90]  Recent Labs    12/23/17 0633 12/24/17 0421  HGB 11.3* 10.4*   Recent Labs    12/23/17 0633 12/24/17 0421  WBC 9.4 6.6  RBC 3.56* 3.31*  HCT 32.6* 30.6*  PLT 163 150   No results for input(s): NA, K, CL, CO2, BUN, CREATININE, GLUCOSE, CALCIUM in the last 72 hours. Recent Labs    12/23/17 0633 12/24/17 0421  INR 1.06 1.21    EXAM General - Patient is Alert, Appropriate and Oriented Extremity - Neurologically intact Neurovascular intact Sensation intact distally Intact pulses distally Dorsiflexion/Plantar flexion intact No cellulitis present Compartment soft Dressing - dressing C/D/I Motor Function - intact, moving foot and toes well on exam.    Past Medical History:  Diagnosis Date  . Arthritis   . Atrial fibrillation ([place])   . Cardiac pacemaker in situ   . Coronary artery disease due to lipid rich plaque    history of coronary artery bypass grafting at [facility] 2007  . Dysrhythmia   .  Hyperlipidemia   . Hypertension   . Hypothyroidism   . Presence of permanent cardiac pacemaker     Assessment/Plan: 2 Days Post-Op Procedure(s) (LRB): COMPUTER ASSISTED TOTAL KNEE ARTHROPLASTY (Left) Active Problems:   S/P total knee arthroplasty  Estimated body mass index is 27.46 kg/m as calculated from the following:   Height as of this encounter: '5\' 4"'$  (1.626 m).   Weight as of this encounter: 72.6 kg (160 lb). Up with therapy Discharge home with home health  Labs: INR 1.26 DVT Prophylaxis - Coumadin, Foot Pumps and TED hose Weight-Bearing as tolerated to left leg Please wash operative leg prior to patient being discharged Please change dressing to operative leg and apply TED stockings to both legs Be sure that the bone phone goes home with patient  Jillyn Ledger. [place] 12/24/2017, 6:55 AM

## 2017-12-24 NOTE — Care Management (Signed)
[facility] unable to draw patients INR, discuss with patient and his wife. They would like to switch home health agencies. [facility]. Patient and wife agreeable with POC.

## 2017-12-24 NOTE — Progress Notes (Signed)
Patient discharge summary reviewed with verbal understanding. TED hose applied. [facility] [place] agency to draw INR Friday and Monday. All equipment verified. 2 Rxs given upon discharge.

## 2017-12-24 NOTE — Care Management Note (Signed)
Case Management Note  Patient Details  Name: [name] MRN: [id number] Date of Birth: October 23, 1940  Subjective/Objective:   POD # 2 left total knee arthroplasty. Met with patient at bedside to discuss discharge planning. He lives at home with his wife. He has a walker and bsc. He is on coumadin. Offered choice of home care providers. Referral to [facility] for home health PT.                  Action/Plan: [facility] notified of discharge.   Expected Discharge Date:  12/24/17               Expected Discharge Plan:  [place]  In-House Referral:     Discharge planning Services  CM Consult  Post Acute Care Choice:  Home Health Choice offered to:  Patient  DME Arranged:    DME Agency:     HH Arranged:  PT [place]:  [facility] (formerly [employer])  Status of Service:  Completed, signed off  If discussed at [employer], dates discussed:    Additional Comments:  Jolly Mango, RN 12/24/2017, 8:55 AM

## 2017-12-26 DIAGNOSIS — I739 Peripheral vascular disease, unspecified: Secondary | ICD-10-CM | POA: Diagnosis not present

## 2017-12-26 DIAGNOSIS — I10 Essential (primary) hypertension: Secondary | ICD-10-CM | POA: Diagnosis not present

## 2017-12-26 DIAGNOSIS — I2583 Coronary atherosclerosis due to lipid rich plaque: Secondary | ICD-10-CM | POA: Diagnosis not present

## 2017-12-26 DIAGNOSIS — I4891 Unspecified atrial fibrillation: Secondary | ICD-10-CM | POA: Diagnosis not present

## 2017-12-26 DIAGNOSIS — E785 Hyperlipidemia, unspecified: Secondary | ICD-10-CM | POA: Diagnosis not present

## 2017-12-26 DIAGNOSIS — D151 Benign neoplasm of heart: Secondary | ICD-10-CM | POA: Diagnosis not present

## 2017-12-26 DIAGNOSIS — Z471 Aftercare following joint replacement surgery: Secondary | ICD-10-CM | POA: Diagnosis not present

## 2017-12-26 DIAGNOSIS — M1990 Unspecified osteoarthritis, unspecified site: Secondary | ICD-10-CM | POA: Diagnosis not present

## 2017-12-26 DIAGNOSIS — I83812 Varicose veins of left lower extremities with pain: Secondary | ICD-10-CM | POA: Diagnosis not present

## 2017-12-29 DIAGNOSIS — I83812 Varicose veins of left lower extremities with pain: Secondary | ICD-10-CM | POA: Diagnosis not present

## 2017-12-29 DIAGNOSIS — I10 Essential (primary) hypertension: Secondary | ICD-10-CM | POA: Diagnosis not present

## 2017-12-29 DIAGNOSIS — M1990 Unspecified osteoarthritis, unspecified site: Secondary | ICD-10-CM | POA: Diagnosis not present

## 2017-12-29 DIAGNOSIS — Z471 Aftercare following joint replacement surgery: Secondary | ICD-10-CM | POA: Diagnosis not present

## 2017-12-29 DIAGNOSIS — I2583 Coronary atherosclerosis due to lipid rich plaque: Secondary | ICD-10-CM | POA: Diagnosis not present

## 2017-12-29 DIAGNOSIS — E785 Hyperlipidemia, unspecified: Secondary | ICD-10-CM | POA: Diagnosis not present

## 2017-12-29 DIAGNOSIS — D151 Benign neoplasm of heart: Secondary | ICD-10-CM | POA: Diagnosis not present

## 2017-12-29 DIAGNOSIS — I4891 Unspecified atrial fibrillation: Secondary | ICD-10-CM | POA: Diagnosis not present

## 2017-12-29 DIAGNOSIS — I739 Peripheral vascular disease, unspecified: Secondary | ICD-10-CM | POA: Diagnosis not present

## 2017-12-31 DIAGNOSIS — E785 Hyperlipidemia, unspecified: Secondary | ICD-10-CM | POA: Diagnosis not present

## 2017-12-31 DIAGNOSIS — M1990 Unspecified osteoarthritis, unspecified site: Secondary | ICD-10-CM | POA: Diagnosis not present

## 2017-12-31 DIAGNOSIS — I83812 Varicose veins of left lower extremities with pain: Secondary | ICD-10-CM | POA: Diagnosis not present

## 2017-12-31 DIAGNOSIS — I2583 Coronary atherosclerosis due to lipid rich plaque: Secondary | ICD-10-CM | POA: Diagnosis not present

## 2017-12-31 DIAGNOSIS — D151 Benign neoplasm of heart: Secondary | ICD-10-CM | POA: Diagnosis not present

## 2017-12-31 DIAGNOSIS — Z471 Aftercare following joint replacement surgery: Secondary | ICD-10-CM | POA: Diagnosis not present

## 2017-12-31 DIAGNOSIS — I10 Essential (primary) hypertension: Secondary | ICD-10-CM | POA: Diagnosis not present

## 2017-12-31 DIAGNOSIS — I739 Peripheral vascular disease, unspecified: Secondary | ICD-10-CM | POA: Diagnosis not present

## 2017-12-31 DIAGNOSIS — I4891 Unspecified atrial fibrillation: Secondary | ICD-10-CM | POA: Diagnosis not present

## 2018-01-02 DIAGNOSIS — I2583 Coronary atherosclerosis due to lipid rich plaque: Secondary | ICD-10-CM | POA: Diagnosis not present

## 2018-01-02 DIAGNOSIS — D151 Benign neoplasm of heart: Secondary | ICD-10-CM | POA: Diagnosis not present

## 2018-01-02 DIAGNOSIS — I10 Essential (primary) hypertension: Secondary | ICD-10-CM | POA: Diagnosis not present

## 2018-01-02 DIAGNOSIS — Z471 Aftercare following joint replacement surgery: Secondary | ICD-10-CM | POA: Diagnosis not present

## 2018-01-02 DIAGNOSIS — I83812 Varicose veins of left lower extremities with pain: Secondary | ICD-10-CM | POA: Diagnosis not present

## 2018-01-02 DIAGNOSIS — E785 Hyperlipidemia, unspecified: Secondary | ICD-10-CM | POA: Diagnosis not present

## 2018-01-02 DIAGNOSIS — I739 Peripheral vascular disease, unspecified: Secondary | ICD-10-CM | POA: Diagnosis not present

## 2018-01-02 DIAGNOSIS — I4891 Unspecified atrial fibrillation: Secondary | ICD-10-CM | POA: Diagnosis not present

## 2018-01-02 DIAGNOSIS — M1990 Unspecified osteoarthritis, unspecified site: Secondary | ICD-10-CM | POA: Diagnosis not present

## 2018-01-05 DIAGNOSIS — I83812 Varicose veins of left lower extremities with pain: Secondary | ICD-10-CM | POA: Diagnosis not present

## 2018-01-05 DIAGNOSIS — I4891 Unspecified atrial fibrillation: Secondary | ICD-10-CM | POA: Diagnosis not present

## 2018-01-05 DIAGNOSIS — I10 Essential (primary) hypertension: Secondary | ICD-10-CM | POA: Diagnosis not present

## 2018-01-05 DIAGNOSIS — I2583 Coronary atherosclerosis due to lipid rich plaque: Secondary | ICD-10-CM | POA: Diagnosis not present

## 2018-01-05 DIAGNOSIS — M1990 Unspecified osteoarthritis, unspecified site: Secondary | ICD-10-CM | POA: Diagnosis not present

## 2018-01-05 DIAGNOSIS — Z471 Aftercare following joint replacement surgery: Secondary | ICD-10-CM | POA: Diagnosis not present

## 2018-01-05 DIAGNOSIS — E785 Hyperlipidemia, unspecified: Secondary | ICD-10-CM | POA: Diagnosis not present

## 2018-01-05 DIAGNOSIS — D151 Benign neoplasm of heart: Secondary | ICD-10-CM | POA: Diagnosis not present

## 2018-01-05 DIAGNOSIS — I739 Peripheral vascular disease, unspecified: Secondary | ICD-10-CM | POA: Diagnosis not present

## 2018-01-06 DIAGNOSIS — M1991 Primary osteoarthritis, unspecified site: Secondary | ICD-10-CM | POA: Diagnosis not present

## 2018-01-06 DIAGNOSIS — M25562 Pain in left knee: Secondary | ICD-10-CM | POA: Diagnosis not present

## 2018-01-06 DIAGNOSIS — M25662 Stiffness of left knee, not elsewhere classified: Secondary | ICD-10-CM | POA: Diagnosis not present

## 2018-01-06 DIAGNOSIS — M2550 Pain in unspecified joint: Secondary | ICD-10-CM | POA: Diagnosis not present

## 2018-01-06 DIAGNOSIS — G894 Chronic pain syndrome: Secondary | ICD-10-CM | POA: Diagnosis not present

## 2018-01-06 DIAGNOSIS — Z1389 Encounter for screening for other disorder: Secondary | ICD-10-CM | POA: Diagnosis not present

## 2018-01-06 DIAGNOSIS — Z96652 Presence of left artificial knee joint: Secondary | ICD-10-CM | POA: Diagnosis not present

## 2018-01-06 DIAGNOSIS — Z7901 Long term (current) use of anticoagulants: Secondary | ICD-10-CM | POA: Diagnosis not present

## 2018-01-06 DIAGNOSIS — Z6824 Body mass index (BMI) 24.0-24.9, adult: Secondary | ICD-10-CM | POA: Diagnosis not present

## 2018-01-06 DIAGNOSIS — M6281 Muscle weakness (generalized): Secondary | ICD-10-CM | POA: Diagnosis not present

## 2018-01-09 DIAGNOSIS — Z96652 Presence of left artificial knee joint: Secondary | ICD-10-CM | POA: Diagnosis not present

## 2018-01-09 DIAGNOSIS — M25562 Pain in left knee: Secondary | ICD-10-CM | POA: Diagnosis not present

## 2018-01-12 DIAGNOSIS — Z96652 Presence of left artificial knee joint: Secondary | ICD-10-CM | POA: Diagnosis not present

## 2018-01-12 DIAGNOSIS — M25562 Pain in left knee: Secondary | ICD-10-CM | POA: Diagnosis not present

## 2018-01-14 DIAGNOSIS — Z96652 Presence of left artificial knee joint: Secondary | ICD-10-CM | POA: Diagnosis not present

## 2018-01-16 DIAGNOSIS — Z96652 Presence of left artificial knee joint: Secondary | ICD-10-CM | POA: Diagnosis not present

## 2018-01-16 DIAGNOSIS — M25562 Pain in left knee: Secondary | ICD-10-CM | POA: Diagnosis not present

## 2018-01-21 DIAGNOSIS — Z96652 Presence of left artificial knee joint: Secondary | ICD-10-CM | POA: Diagnosis not present

## 2018-01-23 DIAGNOSIS — M25562 Pain in left knee: Secondary | ICD-10-CM | POA: Diagnosis not present

## 2018-01-23 DIAGNOSIS — Z96652 Presence of left artificial knee joint: Secondary | ICD-10-CM | POA: Diagnosis not present

## 2018-01-28 DIAGNOSIS — Z96652 Presence of left artificial knee joint: Secondary | ICD-10-CM | POA: Diagnosis not present

## 2018-01-30 DIAGNOSIS — M25562 Pain in left knee: Secondary | ICD-10-CM | POA: Diagnosis not present

## 2018-01-30 DIAGNOSIS — Z96652 Presence of left artificial knee joint: Secondary | ICD-10-CM | POA: Diagnosis not present

## 2018-02-03 DIAGNOSIS — Z96652 Presence of left artificial knee joint: Secondary | ICD-10-CM | POA: Diagnosis not present

## 2018-02-05 ENCOUNTER — Encounter: Payer: Self-pay | Admitting: *Deleted

## 2018-02-06 ENCOUNTER — Encounter: Payer: Self-pay | Admitting: Orthopedic Surgery

## 2018-02-06 ENCOUNTER — Ambulatory Visit: Payer: Medicare HMO | Admitting: Anesthesiology

## 2018-02-06 ENCOUNTER — Encounter
Admission: RE | Disposition: A | Discharge status: Home / Self Care | Payer: Self-pay | Source: Ambulatory Visit | Attending: Orthopedic Surgery

## 2018-02-06 ENCOUNTER — Ambulatory Visit
Admission: RE | Admit: 2018-02-06 | Discharge: 2018-02-06 | Disposition: A | Discharge status: Home / Self Care | Payer: Medicare HMO | Source: Ambulatory Visit | Attending: Orthopedic Surgery | Admitting: Orthopedic Surgery

## 2018-02-06 ENCOUNTER — Other Ambulatory Visit: Payer: Self-pay

## 2018-02-06 DIAGNOSIS — Z8673 Personal history of transient ischemic attack (TIA), and cerebral infarction without residual deficits: Secondary | ICD-10-CM | POA: Diagnosis not present

## 2018-02-06 DIAGNOSIS — Z96652 Presence of left artificial knee joint: Secondary | ICD-10-CM | POA: Insufficient documentation

## 2018-02-06 DIAGNOSIS — E039 Hypothyroidism, unspecified: Secondary | ICD-10-CM | POA: Insufficient documentation

## 2018-02-06 DIAGNOSIS — I10 Essential (primary) hypertension: Secondary | ICD-10-CM | POA: Insufficient documentation

## 2018-02-06 DIAGNOSIS — I4891 Unspecified atrial fibrillation: Secondary | ICD-10-CM | POA: Diagnosis not present

## 2018-02-06 DIAGNOSIS — E785 Hyperlipidemia, unspecified: Secondary | ICD-10-CM | POA: Insufficient documentation

## 2018-02-06 DIAGNOSIS — T8482XA Fibrosis due to internal orthopedic prosthetic devices, implants and grafts, initial encounter: Secondary | ICD-10-CM | POA: Diagnosis not present

## 2018-02-06 DIAGNOSIS — Z950 Presence of cardiac pacemaker: Secondary | ICD-10-CM | POA: Diagnosis not present

## 2018-02-06 DIAGNOSIS — I2510 Atherosclerotic heart disease of native coronary artery without angina pectoris: Secondary | ICD-10-CM | POA: Insufficient documentation

## 2018-02-06 DIAGNOSIS — M24662 Ankylosis, left knee: Secondary | ICD-10-CM | POA: Insufficient documentation

## 2018-02-06 DIAGNOSIS — I739 Peripheral vascular disease, unspecified: Secondary | ICD-10-CM | POA: Diagnosis not present

## 2018-02-06 DIAGNOSIS — Z955 Presence of coronary angioplasty implant and graft: Secondary | ICD-10-CM | POA: Diagnosis not present

## 2018-02-06 DIAGNOSIS — Z951 Presence of aortocoronary bypass graft: Secondary | ICD-10-CM | POA: Insufficient documentation

## 2018-02-06 HISTORY — PX: KNEE CLOSED REDUCTION: SHX995

## 2018-02-06 SURGERY — MANIPULATION, KNEE, CLOSED
Anesthesia: General | Site: Knee | Laterality: Left

## 2018-02-06 MED ORDER — ONDANSETRON HCL 4 MG/2ML IJ SOLN: 4.0000 mg | Freq: Four times a day (QID) | INTRAMUSCULAR | Status: DC | PRN

## 2018-02-06 MED ORDER — ACETAMINOPHEN 10 MG/ML IV SOLN
1000.0000 mg | Freq: Once | INTRAVENOUS | Status: AC
  Administered 2018-02-06: 1000 mg via INTRAVENOUS

## 2018-02-06 MED ORDER — PROPOFOL 10 MG/ML IV BOLUS
INTRAVENOUS | Status: AC
  Filled 2018-02-06: qty 20

## 2018-02-06 MED ORDER — SODIUM CHLORIDE 0.9 % IV SOLN: INTRAVENOUS | Status: DC

## 2018-02-06 MED ORDER — ACETAMINOPHEN 10 MG/ML IV SOLN
INTRAVENOUS | Status: AC
  Filled 2018-02-06: qty 100

## 2018-02-06 MED ORDER — FAMOTIDINE 20 MG PO TABS
ORAL_TABLET | ORAL | Status: AC
  Filled 2018-02-06: qty 1

## 2018-02-06 MED ORDER — FAMOTIDINE 20 MG PO TABS
20.0000 mg | ORAL_TABLET | Freq: Once | ORAL | Status: AC
  Administered 2018-02-06: 20 mg via ORAL

## 2018-02-06 MED ORDER — METOCLOPRAMIDE HCL 10 MG PO TABS: 5.0000 mg | ORAL_TABLET | Freq: Three times a day (TID) | ORAL | Status: DC | PRN

## 2018-02-06 MED ORDER — ONDANSETRON HCL 4 MG PO TABS: 4.0000 mg | ORAL_TABLET | Freq: Four times a day (QID) | ORAL | Status: DC | PRN

## 2018-02-06 MED ORDER — OXYCODONE HCL 5 MG PO TABS: 5.0000 mg | ORAL_TABLET | Freq: Once | ORAL | Status: DC | PRN

## 2018-02-06 MED ORDER — LACTATED RINGERS IV SOLN
INTRAVENOUS | Status: DC
  Administered 2018-02-06 (×2): via INTRAVENOUS

## 2018-02-06 MED ORDER — FENTANYL CITRATE (PF) 100 MCG/2ML IJ SOLN
INTRAMUSCULAR | Status: AC
  Administered 2018-02-06: 50 ug via INTRAVENOUS
  Filled 2018-02-06: qty 2

## 2018-02-06 MED ORDER — METOCLOPRAMIDE HCL 5 MG/ML IJ SOLN: 5.0000 mg | Freq: Three times a day (TID) | INTRAMUSCULAR | Status: DC | PRN

## 2018-02-06 MED ORDER — FENTANYL CITRATE (PF) 100 MCG/2ML IJ SOLN
INTRAMUSCULAR | Status: DC | PRN
  Administered 2018-02-06 (×2): 50 ug via INTRAVENOUS

## 2018-02-06 MED ORDER — PROPOFOL 10 MG/ML IV BOLUS
INTRAVENOUS | Status: DC | PRN
  Administered 2018-02-06: 60 mg via INTRAVENOUS
  Administered 2018-02-06: 20 mg via INTRAVENOUS

## 2018-02-06 MED ORDER — LIDOCAINE HCL (CARDIAC) PF 100 MG/5ML IV SOSY
PREFILLED_SYRINGE | INTRAVENOUS | Status: DC | PRN
  Administered 2018-02-06: 50 mg via INTRAVENOUS

## 2018-02-06 MED ORDER — CHLORHEXIDINE GLUCONATE 4 % EX LIQD: 60.0000 mL | Freq: Once | CUTANEOUS | Status: DC

## 2018-02-06 MED ORDER — FENTANYL CITRATE (PF) 100 MCG/2ML IJ SOLN
25.0000 ug | INTRAMUSCULAR | Status: DC | PRN
  Administered 2018-02-06 (×3): 50 ug via INTRAVENOUS

## 2018-02-06 MED ORDER — FENTANYL CITRATE (PF) 100 MCG/2ML IJ SOLN
INTRAMUSCULAR | Status: AC
  Filled 2018-02-06: qty 2

## 2018-02-06 MED ORDER — OXYCODONE HCL 5 MG/5ML PO SOLN: 5.0000 mg | Freq: Once | ORAL | Status: DC | PRN

## 2018-02-06 MED ORDER — HYDROCODONE-ACETAMINOPHEN 5-325 MG PO TABS: 1.0000 | ORAL_TABLET | ORAL | Status: DC | PRN

## 2018-02-06 SURGICAL SUPPLY — 1 items: KIT TURNOVER KIT A (KITS) ×3 IMPLANT

## 2018-02-06 NOTE — OR Nursing (Signed)
Patient reports eating a plain bagel around 5:30 this am.  Anesthesia and Dr Marry Guan aware patient rescheduled around 1:30.

## 2018-02-06 NOTE — Discharge Instructions (Signed)
General Anesthesia, Adult, Care After °These instructions provide you with information about caring for yourself after your procedure. Your health care provider may also give you more specific instructions. Your treatment has been planned according to current medical practices, but problems sometimes occur. Call your health care provider if you have any problems or questions after your procedure. °What can I expect after the procedure? °After the procedure, it is common to have: °· Vomiting. °· A sore throat. °· Mental slowness. ° °It is common to feel: °· Nauseous. °· Cold or shivery. °· Sleepy. °· Tired. °· Sore or achy, even in parts of your body where you did not have surgery. ° °Follow these instructions at home: °For at least 24 hours after the procedure: °· Do not: °? Participate in activities where you could fall or become injured. °? Drive. °? Use heavy machinery. °? Drink alcohol. °? Take sleeping pills or medicines that cause drowsiness. °? Make important decisions or sign legal documents. °? Take care of children on your own. °· Rest. °Eating and drinking °· If you vomit, drink water, juice, or soup when you can drink without vomiting. °· Drink enough fluid to keep your urine clear or pale yellow. °· Make sure you have little or no nausea before eating solid foods. °· Follow the diet recommended by your health care provider. °General instructions °· Have a responsible adult stay with you until you are awake and alert. °· Return to your normal activities as told by your health care provider. Ask your health care provider what activities are safe for you. °· Take over-the-counter and prescription medicines only as told by your health care provider. °· If you smoke, do not smoke without supervision. °· Keep all follow-up visits as told by your health care provider. This is important. °Contact a health care provider if: °· You continue to have nausea or vomiting at home, and medicines are not helpful. °· You  cannot drink fluids or start eating again. °· You cannot urinate after 8-12 hours. °· You develop a skin rash. °· You have fever. °· You have increasing redness at the site of your procedure. °Get help right away if: °· You have difficulty breathing. °· You have chest pain. °· You have unexpected bleeding. °· You feel that you are having a life-threatening or urgent problem. °This information is not intended to replace advice given to you by your health care provider. Make sure you discuss any questions you have with your health care provider. °Document Released: 11/04/2000 Document Revised: 01/01/2016 Document Reviewed: 07/13/2015 °Elsevier Interactive Patient Education © 2018 [employer] Inc[employer] ° °

## 2018-02-06 NOTE — H&P (Signed)
The patient has been re-examined, and the chart reviewed, and there have been no interval changes to the documented history and physical.    The risks, benefits, and alternatives have been discussed at length. The patient expressed understanding of the risks benefits and agreed with plans for surgical intervention.  James P. Hooten, Jr. M.D.    

## 2018-02-06 NOTE — Transfer of Care (Signed)
Immediate Anesthesia Transfer of Care Note  Patient: [name]  Procedure(s) Performed: CLOSED MANIPULATION KNEE (Left Knee)  Patient Location: PACU  Anesthesia Type:General  Level of Consciousness: awake  Airway & Oxygen Therapy: Patient Spontanous Breathing  Post-op Assessment: Report given to RN  Post vital signs: stable  Last Vitals:  Vitals Value Taken Time  BP    Temp    Pulse 66 02/06/2018  1:28 PM  Resp    SpO2 94 % 02/06/2018  1:28 PM  Vitals shown include unvalidated device data.  Last Pain:  Vitals:   02/06/18 1003  TempSrc: Temporal  PainSc: 0-No pain         Complications: No apparent anesthesia complications

## 2018-02-06 NOTE — Anesthesia Post-op Follow-up Note (Signed)
Anesthesia QCDR form completed.        

## 2018-02-06 NOTE — Anesthesia Preprocedure Evaluation (Addendum)
Anesthesia Evaluation  Patient identified by MRN, date of birth, ID band Patient awake    Reviewed: Allergy & Precautions, H&P , NPO status , Patient's Chart, lab work & pertinent test results  History of Anesthesia Complications Negative for: history of anesthetic complications  Airway Mallampati: III   Neck ROM: limited    Dental  (+) Chipped, Poor Dentition, Upper Dentures, Partial Lower   Pulmonary neg pulmonary ROS, neg shortness of breath,           Cardiovascular Exercise Tolerance: Good hypertension, (-) angina+ CAD and + Peripheral Vascular Disease  (-) Past MI, (-) CABG and (-) DOE + dysrhythmias + pacemaker      Neuro/Psych CVA negative psych ROS   GI/Hepatic negative GI ROS, Neg liver ROS, neg GERD  ,  Endo/Other  Hypothyroidism   Renal/GU      Musculoskeletal  (+) Arthritis ,   Abdominal   Peds  Hematology negative hematology ROS (+)   Anesthesia Other Findings Past Medical History: No date: Arthritis No date: Atrial fibrillation (HCC) No date: Cardiac pacemaker in situ No date: Coronary artery disease due to lipid rich plaque     Comment:  history of coronary artery bypass grafting at [facility] No date: Dysrhythmia No date: Hyperlipidemia No date: Hypertension No date: Hypothyroidism No date: Presence of permanent cardiac pacemaker  Past Surgical History: No date: BACK SURGERY No date: CATARACT EXTRACTION W/ INTRAOCULAR LENS IMPLANT; Bilateral No date: CORONARY ARTERY BYPASS GRAFT No date: FRACTURE SURGERY; Left No date: HERNIA REPAIR No date: HIP FRACTURE SURGERY No date: INSERT / REPLACE / REMOVE PACEMAKER 12/22/2017: KNEE ARTHROPLASTY; Left     Comment:  Procedure: COMPUTER ASSISTED TOTAL KNEE ARTHROPLASTY;                Surgeon: Dereck Leep, MD;  Location: [facility] ORS;                Service: Orthopedics;  Laterality: Left; No date: open  heart surgery 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 No date: VENTRICULAR CARDIAC PACEMAKER INSERTION  BMI    Body Mass Index:  23.89 kg/m      Reproductive/Obstetrics negative OB ROS                            Anesthesia Physical Anesthesia Plan  ASA: III  Anesthesia Plan: General LMA   Post-op Pain Management:    Induction: Intravenous  PONV Risk Score and Plan: Ondansetron and Dexamethasone  Airway Management Planned: LMA  Additional Equipment:   Intra-op Plan:   Post-operative Plan: Extubation in OR  Informed Consent: I have reviewed the patients History and Physical, chart, labs and discussed the procedure including the risks, benefits and alternatives for the proposed anesthesia with the patient or authorized representative who has indicated his/her understanding and acceptance.   Dental Advisory Given  Plan Discussed with: Anesthesiologist, CRNA and Surgeon  Anesthesia Plan Comments: (Patient consented for risks of anesthesia including but not limited to:  - adverse reactions to medications - damage to teeth, lips or other oral mucosa - sore throat or hoarseness - Damage to heart, brain, lungs or loss of life  Patient voiced understanding.)        Anesthesia Quick Evaluation

## 2018-02-06 NOTE — Op Note (Signed)
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   ASSISTANT: None  ANESTHESIA: general  ESTIMATED BLOOD LOSS: None  FLUIDS REPLACED: 200 mL of crystalloid  INDICATIONS FOR SURGERY: [name] is a 77 y.o. year old male who underwent left total knee approximately 6 weeks ago.  Despite aggressive physical therapy the patient was noted to have limited knee flexion. After discussion of the risks and benefits of surgical intervention, the patient expressed understanding of the risks benefits and agree with plans for closed manipulation of the left knee under anesthesia.   PROCEDURE IN DETAIL: The patient was brought into the operating room and, after adequate general anesthesia was achieved, a timeout was performed as per usual protocol.  The knee was initially flexed with only 90 degrees of flexion obtainable.  I positioned the patient's left tibia against my shoulder and gently flexed the knee.  There was audible lysis of adhesions with gradual improvement of his knee flexion.  After conclusion of the manipulation, measurements were repeated and the patient demonstrated 128 degrees of flexion.  The patient tolerated the procedure well.  He was transported to the recovery room in stable condition.   Loghan Kurtzman P. Holley Bouche M.D.

## 2018-02-07 NOTE — Anesthesia Postprocedure Evaluation (Signed)
Anesthesia Post Note  Patient: [name]  Procedure(s) Performed: CLOSED MANIPULATION KNEE (Left Knee)  Patient location during evaluation: PACU Anesthesia Type: General Level of consciousness: awake and alert Pain management: pain level controlled Vital Signs Assessment: post-procedure vital signs reviewed and stable Respiratory status: spontaneous breathing, nonlabored ventilation, respiratory function stable and patient connected to nasal cannula oxygen Cardiovascular status: blood pressure returned to baseline and stable Postop Assessment: no apparent nausea or vomiting Anesthetic complications: no     Last Vitals:  Vitals:   02/06/18 1408 02/06/18 1430  BP: (!) 168/77 (!) 161/64  Pulse: 64 66  Resp: 16 14  Temp: 36.6 C   SpO2: 99% 99%    Last Pain:  Vitals:   02/06/18 1442  TempSrc:   PainSc: 2                  [name]

## 2018-02-08 ENCOUNTER — Encounter: Payer: Self-pay | Admitting: Orthopedic Surgery

## 2018-02-09 DIAGNOSIS — M25562 Pain in left knee: Secondary | ICD-10-CM | POA: Diagnosis not present

## 2018-02-09 DIAGNOSIS — M6281 Muscle weakness (generalized): Secondary | ICD-10-CM | POA: Diagnosis not present

## 2018-02-09 DIAGNOSIS — M25662 Stiffness of left knee, not elsewhere classified: Secondary | ICD-10-CM | POA: Diagnosis not present

## 2018-02-09 DIAGNOSIS — Z96652 Presence of left artificial knee joint: Secondary | ICD-10-CM | POA: Diagnosis not present

## 2018-02-10 DIAGNOSIS — G894 Chronic pain syndrome: Secondary | ICD-10-CM | POA: Diagnosis not present

## 2018-02-10 DIAGNOSIS — F419 Anxiety disorder, unspecified: Secondary | ICD-10-CM | POA: Diagnosis not present

## 2018-02-10 DIAGNOSIS — R634 Abnormal weight loss: Secondary | ICD-10-CM | POA: Diagnosis not present

## 2018-02-10 DIAGNOSIS — Z6823 Body mass index (BMI) 23.0-23.9, adult: Secondary | ICD-10-CM | POA: Diagnosis not present

## 2018-02-10 DIAGNOSIS — E063 Autoimmune thyroiditis: Secondary | ICD-10-CM | POA: Diagnosis not present

## 2018-02-10 DIAGNOSIS — M159 Polyosteoarthritis, unspecified: Secondary | ICD-10-CM | POA: Diagnosis not present

## 2018-02-10 DIAGNOSIS — Z7901 Long term (current) use of anticoagulants: Secondary | ICD-10-CM | POA: Diagnosis not present

## 2018-02-11 DIAGNOSIS — Z96652 Presence of left artificial knee joint: Secondary | ICD-10-CM | POA: Diagnosis not present

## 2018-02-11 DIAGNOSIS — M25562 Pain in left knee: Secondary | ICD-10-CM | POA: Diagnosis not present

## 2018-02-13 DIAGNOSIS — M25562 Pain in left knee: Secondary | ICD-10-CM | POA: Diagnosis not present

## 2018-02-13 DIAGNOSIS — Z96652 Presence of left artificial knee joint: Secondary | ICD-10-CM | POA: Diagnosis not present

## 2018-02-16 DIAGNOSIS — Z96652 Presence of left artificial knee joint: Secondary | ICD-10-CM | POA: Diagnosis not present

## 2018-02-16 DIAGNOSIS — M25562 Pain in left knee: Secondary | ICD-10-CM | POA: Diagnosis not present

## 2018-02-20 DIAGNOSIS — M25562 Pain in left knee: Secondary | ICD-10-CM | POA: Diagnosis not present

## 2018-02-20 DIAGNOSIS — Z96652 Presence of left artificial knee joint: Secondary | ICD-10-CM | POA: Diagnosis not present

## 2018-02-23 DIAGNOSIS — Z96652 Presence of left artificial knee joint: Secondary | ICD-10-CM | POA: Diagnosis not present

## 2018-02-23 DIAGNOSIS — M25562 Pain in left knee: Secondary | ICD-10-CM | POA: Diagnosis not present

## 2018-02-25 DIAGNOSIS — Z96652 Presence of left artificial knee joint: Secondary | ICD-10-CM | POA: Diagnosis not present

## 2018-02-25 DIAGNOSIS — M25562 Pain in left knee: Secondary | ICD-10-CM | POA: Diagnosis not present

## 2018-02-27 DIAGNOSIS — Z96652 Presence of left artificial knee joint: Secondary | ICD-10-CM | POA: Diagnosis not present

## 2018-03-04 DIAGNOSIS — Z96652 Presence of left artificial knee joint: Secondary | ICD-10-CM | POA: Diagnosis not present

## 2018-03-04 DIAGNOSIS — M25562 Pain in left knee: Secondary | ICD-10-CM | POA: Diagnosis not present

## 2018-03-05 DIAGNOSIS — Z7901 Long term (current) use of anticoagulants: Secondary | ICD-10-CM | POA: Diagnosis not present

## 2018-03-06 DIAGNOSIS — Z96652 Presence of left artificial knee joint: Secondary | ICD-10-CM | POA: Diagnosis not present

## 2018-03-06 DIAGNOSIS — M25562 Pain in left knee: Secondary | ICD-10-CM | POA: Diagnosis not present

## 2018-03-09 DIAGNOSIS — Z96652 Presence of left artificial knee joint: Secondary | ICD-10-CM | POA: Diagnosis not present

## 2018-03-13 DIAGNOSIS — Z7901 Long term (current) use of anticoagulants: Secondary | ICD-10-CM | POA: Diagnosis not present

## 2018-03-20 DIAGNOSIS — Z7901 Long term (current) use of anticoagulants: Secondary | ICD-10-CM | POA: Diagnosis not present

## 2018-03-24 DIAGNOSIS — I482 Chronic atrial fibrillation: Secondary | ICD-10-CM | POA: Diagnosis not present

## 2018-04-09 DIAGNOSIS — Z7901 Long term (current) use of anticoagulants: Secondary | ICD-10-CM | POA: Diagnosis not present

## 2018-04-20 DIAGNOSIS — Z7901 Long term (current) use of anticoagulants: Secondary | ICD-10-CM | POA: Diagnosis not present

## 2018-04-24 DIAGNOSIS — M25562 Pain in left knee: Secondary | ICD-10-CM | POA: Diagnosis not present

## 2018-05-06 DIAGNOSIS — K219 Gastro-esophageal reflux disease without esophagitis: Secondary | ICD-10-CM | POA: Diagnosis not present

## 2018-05-06 DIAGNOSIS — Z1389 Encounter for screening for other disorder: Secondary | ICD-10-CM | POA: Diagnosis not present

## 2018-05-06 DIAGNOSIS — Z23 Encounter for immunization: Secondary | ICD-10-CM | POA: Diagnosis not present

## 2018-05-06 DIAGNOSIS — M2550 Pain in unspecified joint: Secondary | ICD-10-CM | POA: Diagnosis not present

## 2018-05-06 DIAGNOSIS — G894 Chronic pain syndrome: Secondary | ICD-10-CM | POA: Diagnosis not present

## 2018-05-06 DIAGNOSIS — M1991 Primary osteoarthritis, unspecified site: Secondary | ICD-10-CM | POA: Diagnosis not present

## 2018-05-06 DIAGNOSIS — Z6824 Body mass index (BMI) 24.0-24.9, adult: Secondary | ICD-10-CM | POA: Diagnosis not present

## 2018-06-03 DIAGNOSIS — Z7901 Long term (current) use of anticoagulants: Secondary | ICD-10-CM | POA: Diagnosis not present

## 2018-06-05 DIAGNOSIS — G894 Chronic pain syndrome: Secondary | ICD-10-CM | POA: Diagnosis not present

## 2018-06-05 DIAGNOSIS — K219 Gastro-esophageal reflux disease without esophagitis: Secondary | ICD-10-CM | POA: Diagnosis not present

## 2018-06-05 DIAGNOSIS — M1991 Primary osteoarthritis, unspecified site: Secondary | ICD-10-CM | POA: Diagnosis not present

## 2018-06-05 DIAGNOSIS — Z6825 Body mass index (BMI) 25.0-25.9, adult: Secondary | ICD-10-CM | POA: Diagnosis not present

## 2018-07-01 DIAGNOSIS — Z7901 Long term (current) use of anticoagulants: Secondary | ICD-10-CM | POA: Diagnosis not present

## 2018-07-30 DIAGNOSIS — E663 Overweight: Secondary | ICD-10-CM | POA: Diagnosis not present

## 2018-07-30 DIAGNOSIS — Z7901 Long term (current) use of anticoagulants: Secondary | ICD-10-CM | POA: Diagnosis not present

## 2018-07-30 DIAGNOSIS — M2550 Pain in unspecified joint: Secondary | ICD-10-CM | POA: Diagnosis not present

## 2018-07-30 DIAGNOSIS — M1991 Primary osteoarthritis, unspecified site: Secondary | ICD-10-CM | POA: Diagnosis not present

## 2018-07-30 DIAGNOSIS — Z6825 Body mass index (BMI) 25.0-25.9, adult: Secondary | ICD-10-CM | POA: Diagnosis not present

## 2018-07-30 DIAGNOSIS — G894 Chronic pain syndrome: Secondary | ICD-10-CM | POA: Diagnosis not present

## 2018-07-30 DIAGNOSIS — K219 Gastro-esophageal reflux disease without esophagitis: Secondary | ICD-10-CM | POA: Diagnosis not present

## 2018-08-28 DIAGNOSIS — M2550 Pain in unspecified joint: Secondary | ICD-10-CM | POA: Diagnosis not present

## 2018-08-28 DIAGNOSIS — E7849 Other hyperlipidemia: Secondary | ICD-10-CM | POA: Diagnosis not present

## 2018-08-28 DIAGNOSIS — E119 Type 2 diabetes mellitus without complications: Secondary | ICD-10-CM | POA: Diagnosis not present

## 2018-08-28 DIAGNOSIS — Z0001 Encounter for general adult medical examination with abnormal findings: Secondary | ICD-10-CM | POA: Diagnosis not present

## 2018-08-28 DIAGNOSIS — I639 Cerebral infarction, unspecified: Secondary | ICD-10-CM | POA: Diagnosis not present

## 2018-08-28 DIAGNOSIS — E063 Autoimmune thyroiditis: Secondary | ICD-10-CM | POA: Diagnosis not present

## 2018-08-28 DIAGNOSIS — M159 Polyosteoarthritis, unspecified: Secondary | ICD-10-CM | POA: Diagnosis not present

## 2018-08-28 DIAGNOSIS — I10 Essential (primary) hypertension: Secondary | ICD-10-CM | POA: Diagnosis not present

## 2018-08-28 DIAGNOSIS — I2510 Atherosclerotic heart disease of native coronary artery without angina pectoris: Secondary | ICD-10-CM | POA: Diagnosis not present

## 2018-08-28 DIAGNOSIS — N189 Chronic kidney disease, unspecified: Secondary | ICD-10-CM | POA: Diagnosis not present

## 2018-08-28 DIAGNOSIS — Z1389 Encounter for screening for other disorder: Secondary | ICD-10-CM | POA: Diagnosis not present

## 2018-09-22 DIAGNOSIS — I5022 Chronic systolic (congestive) heart failure: Secondary | ICD-10-CM | POA: Diagnosis not present

## 2018-09-29 DIAGNOSIS — Z7901 Long term (current) use of anticoagulants: Secondary | ICD-10-CM | POA: Diagnosis not present

## 2018-09-29 DIAGNOSIS — M25562 Pain in left knee: Secondary | ICD-10-CM | POA: Diagnosis not present

## 2018-09-29 DIAGNOSIS — E039 Hypothyroidism, unspecified: Secondary | ICD-10-CM | POA: Diagnosis not present

## 2018-09-29 DIAGNOSIS — G894 Chronic pain syndrome: Secondary | ICD-10-CM | POA: Diagnosis not present

## 2018-09-29 DIAGNOSIS — I10 Essential (primary) hypertension: Secondary | ICD-10-CM | POA: Diagnosis not present

## 2018-10-28 DIAGNOSIS — R600 Localized edema: Secondary | ICD-10-CM | POA: Diagnosis not present

## 2018-10-28 DIAGNOSIS — I10 Essential (primary) hypertension: Secondary | ICD-10-CM | POA: Diagnosis not present

## 2018-10-28 DIAGNOSIS — M2550 Pain in unspecified joint: Secondary | ICD-10-CM | POA: Diagnosis not present

## 2018-10-28 DIAGNOSIS — G894 Chronic pain syndrome: Secondary | ICD-10-CM | POA: Diagnosis not present

## 2018-10-29 DIAGNOSIS — Z7901 Long term (current) use of anticoagulants: Secondary | ICD-10-CM | POA: Diagnosis not present

## 2018-11-26 DIAGNOSIS — Z7901 Long term (current) use of anticoagulants: Secondary | ICD-10-CM | POA: Diagnosis not present

## 2018-12-23 DIAGNOSIS — Z1389 Encounter for screening for other disorder: Secondary | ICD-10-CM | POA: Diagnosis not present

## 2018-12-23 DIAGNOSIS — Z7901 Long term (current) use of anticoagulants: Secondary | ICD-10-CM | POA: Diagnosis not present

## 2018-12-23 DIAGNOSIS — M1991 Primary osteoarthritis, unspecified site: Secondary | ICD-10-CM | POA: Diagnosis not present

## 2018-12-23 DIAGNOSIS — M47814 Spondylosis without myelopathy or radiculopathy, thoracic region: Secondary | ICD-10-CM | POA: Diagnosis not present

## 2018-12-23 DIAGNOSIS — G894 Chronic pain syndrome: Secondary | ICD-10-CM | POA: Diagnosis not present

## 2018-12-23 DIAGNOSIS — I639 Cerebral infarction, unspecified: Secondary | ICD-10-CM | POA: Diagnosis not present

## 2019-01-01 ENCOUNTER — Other Ambulatory Visit: Payer: Self-pay

## 2019-01-01 NOTE — Patient Outreach (Signed)
[facility]) Care Management  01/01/2019  [name] 1940/11/25 [id number]   Medication Adherence call to Mr. [name] Hippa Identifiers Verify spoke with patient he is due on Lisinopril 20 mg and Simvastatin 20 mg patient explain he is taking 1 tablet daily and has already order from the pharmacy and will pick up today. [name] is showing past due under [place].   [place] Management Direct Dial [phone]  Fax [phone] [email or web address]

## 2019-01-22 DIAGNOSIS — Z7901 Long term (current) use of anticoagulants: Secondary | ICD-10-CM | POA: Diagnosis not present

## 2019-02-17 DIAGNOSIS — E7800 Pure hypercholesterolemia, unspecified: Secondary | ICD-10-CM | POA: Diagnosis not present

## 2019-02-17 DIAGNOSIS — I5022 Chronic systolic (congestive) heart failure: Secondary | ICD-10-CM | POA: Diagnosis not present

## 2019-02-17 DIAGNOSIS — I482 Chronic atrial fibrillation, unspecified: Secondary | ICD-10-CM | POA: Diagnosis not present

## 2019-02-17 DIAGNOSIS — I25738 Atherosclerosis of nonautologous biological coronary artery bypass graft(s) with other forms of angina pectoris: Secondary | ICD-10-CM | POA: Diagnosis not present

## 2019-02-17 DIAGNOSIS — I10 Essential (primary) hypertension: Secondary | ICD-10-CM | POA: Diagnosis not present

## 2019-02-24 DIAGNOSIS — M1991 Primary osteoarthritis, unspecified site: Secondary | ICD-10-CM | POA: Diagnosis not present

## 2019-02-24 DIAGNOSIS — Z6824 Body mass index (BMI) 24.0-24.9, adult: Secondary | ICD-10-CM | POA: Diagnosis not present

## 2019-02-24 DIAGNOSIS — Z7901 Long term (current) use of anticoagulants: Secondary | ICD-10-CM | POA: Diagnosis not present

## 2019-02-24 DIAGNOSIS — Z1389 Encounter for screening for other disorder: Secondary | ICD-10-CM | POA: Diagnosis not present

## 2019-02-24 DIAGNOSIS — G894 Chronic pain syndrome: Secondary | ICD-10-CM | POA: Diagnosis not present

## 2019-02-24 DIAGNOSIS — I639 Cerebral infarction, unspecified: Secondary | ICD-10-CM | POA: Diagnosis not present

## 2019-03-26 DIAGNOSIS — Z7901 Long term (current) use of anticoagulants: Secondary | ICD-10-CM | POA: Diagnosis not present

## 2019-04-08 DIAGNOSIS — I4891 Unspecified atrial fibrillation: Secondary | ICD-10-CM | POA: Diagnosis not present

## 2019-04-26 DIAGNOSIS — I10 Essential (primary) hypertension: Secondary | ICD-10-CM | POA: Diagnosis not present

## 2019-04-26 DIAGNOSIS — D4989 Neoplasm of unspecified behavior of other specified sites: Secondary | ICD-10-CM | POA: Diagnosis not present

## 2019-04-26 DIAGNOSIS — Z7901 Long term (current) use of anticoagulants: Secondary | ICD-10-CM | POA: Diagnosis not present

## 2019-04-26 DIAGNOSIS — G894 Chronic pain syndrome: Secondary | ICD-10-CM | POA: Diagnosis not present

## 2019-04-26 DIAGNOSIS — M67471 Ganglion, right ankle and foot: Secondary | ICD-10-CM | POA: Diagnosis not present

## 2019-04-29 DIAGNOSIS — H26492 Other secondary cataract, left eye: Secondary | ICD-10-CM | POA: Diagnosis not present

## 2019-05-06 DIAGNOSIS — Z7901 Long term (current) use of anticoagulants: Secondary | ICD-10-CM | POA: Diagnosis not present

## 2019-05-12 DIAGNOSIS — E063 Autoimmune thyroiditis: Secondary | ICD-10-CM | POA: Diagnosis not present

## 2019-05-12 DIAGNOSIS — E7849 Other hyperlipidemia: Secondary | ICD-10-CM | POA: Diagnosis not present

## 2019-05-12 DIAGNOSIS — I10 Essential (primary) hypertension: Secondary | ICD-10-CM | POA: Diagnosis not present

## 2019-05-12 DIAGNOSIS — N189 Chronic kidney disease, unspecified: Secondary | ICD-10-CM | POA: Diagnosis not present

## 2019-05-13 DIAGNOSIS — Z7901 Long term (current) use of anticoagulants: Secondary | ICD-10-CM | POA: Diagnosis not present

## 2019-05-20 DIAGNOSIS — I10 Essential (primary) hypertension: Secondary | ICD-10-CM | POA: Diagnosis not present

## 2019-05-20 DIAGNOSIS — Z23 Encounter for immunization: Secondary | ICD-10-CM | POA: Diagnosis not present

## 2019-05-20 DIAGNOSIS — G894 Chronic pain syndrome: Secondary | ICD-10-CM | POA: Diagnosis not present

## 2019-05-20 DIAGNOSIS — M1991 Primary osteoarthritis, unspecified site: Secondary | ICD-10-CM | POA: Diagnosis not present

## 2019-05-20 DIAGNOSIS — L03114 Cellulitis of left upper limb: Secondary | ICD-10-CM | POA: Diagnosis not present

## 2019-05-20 DIAGNOSIS — M2550 Pain in unspecified joint: Secondary | ICD-10-CM | POA: Diagnosis not present

## 2019-05-20 DIAGNOSIS — Z7901 Long term (current) use of anticoagulants: Secondary | ICD-10-CM | POA: Diagnosis not present

## 2019-06-10 ENCOUNTER — Other Ambulatory Visit (HOSPITAL_COMMUNITY): Payer: Self-pay | Admitting: Internal Medicine

## 2019-06-10 ENCOUNTER — Other Ambulatory Visit: Payer: Self-pay

## 2019-06-10 ENCOUNTER — Ambulatory Visit (HOSPITAL_COMMUNITY)
Admission: RE | Admit: 2019-06-10 | Discharge: 2019-06-10 | Disposition: A | Discharge status: Home / Self Care | Payer: Medicare Other | Source: Ambulatory Visit | Attending: Internal Medicine | Admitting: Internal Medicine

## 2019-06-10 DIAGNOSIS — M25532 Pain in left wrist: Secondary | ICD-10-CM | POA: Insufficient documentation

## 2019-06-10 DIAGNOSIS — Z6824 Body mass index (BMI) 24.0-24.9, adult: Secondary | ICD-10-CM | POA: Diagnosis not present

## 2019-06-10 DIAGNOSIS — K219 Gastro-esophageal reflux disease without esophagitis: Secondary | ICD-10-CM | POA: Diagnosis not present

## 2019-06-10 DIAGNOSIS — M779 Enthesopathy, unspecified: Secondary | ICD-10-CM | POA: Diagnosis not present

## 2019-06-10 DIAGNOSIS — M19032 Primary osteoarthritis, left wrist: Secondary | ICD-10-CM | POA: Diagnosis not present

## 2019-06-10 DIAGNOSIS — M1991 Primary osteoarthritis, unspecified site: Secondary | ICD-10-CM | POA: Diagnosis not present

## 2019-06-12 DIAGNOSIS — N182 Chronic kidney disease, stage 2 (mild): Secondary | ICD-10-CM | POA: Diagnosis not present

## 2019-06-12 DIAGNOSIS — E1122 Type 2 diabetes mellitus with diabetic chronic kidney disease: Secondary | ICD-10-CM | POA: Diagnosis not present

## 2019-06-12 DIAGNOSIS — I129 Hypertensive chronic kidney disease with stage 1 through stage 4 chronic kidney disease, or unspecified chronic kidney disease: Secondary | ICD-10-CM | POA: Diagnosis not present

## 2019-06-15 DIAGNOSIS — L821 Other seborrheic keratosis: Secondary | ICD-10-CM | POA: Diagnosis not present

## 2019-06-15 DIAGNOSIS — L570 Actinic keratosis: Secondary | ICD-10-CM | POA: Diagnosis not present

## 2019-06-15 DIAGNOSIS — L720 Epidermal cyst: Secondary | ICD-10-CM | POA: Diagnosis not present

## 2019-06-15 DIAGNOSIS — C44612 Basal cell carcinoma of skin of right upper limb, including shoulder: Secondary | ICD-10-CM | POA: Diagnosis not present

## 2019-06-15 DIAGNOSIS — D485 Neoplasm of uncertain behavior of skin: Secondary | ICD-10-CM | POA: Diagnosis not present

## 2019-06-15 DIAGNOSIS — D1801 Hemangioma of skin and subcutaneous tissue: Secondary | ICD-10-CM | POA: Diagnosis not present

## 2019-06-28 DIAGNOSIS — M1991 Primary osteoarthritis, unspecified site: Secondary | ICD-10-CM | POA: Diagnosis not present

## 2019-06-28 DIAGNOSIS — M779 Enthesopathy, unspecified: Secondary | ICD-10-CM | POA: Diagnosis not present

## 2019-06-28 DIAGNOSIS — Z7901 Long term (current) use of anticoagulants: Secondary | ICD-10-CM | POA: Diagnosis not present

## 2019-06-28 DIAGNOSIS — G894 Chronic pain syndrome: Secondary | ICD-10-CM | POA: Diagnosis not present

## 2019-06-28 DIAGNOSIS — Z6824 Body mass index (BMI) 24.0-24.9, adult: Secondary | ICD-10-CM | POA: Diagnosis not present

## 2019-06-28 DIAGNOSIS — I4891 Unspecified atrial fibrillation: Secondary | ICD-10-CM | POA: Diagnosis not present

## 2019-07-06 ENCOUNTER — Encounter: Payer: Self-pay | Admitting: Orthopaedic Surgery

## 2019-07-06 ENCOUNTER — Ambulatory Visit: Payer: Medicare Other | Admitting: Orthopaedic Surgery

## 2019-07-06 ENCOUNTER — Other Ambulatory Visit: Payer: Self-pay

## 2019-07-06 VITALS — BP 138/74 | HR 67 | Ht 66.0 in | Wt 149.0 lb

## 2019-07-06 DIAGNOSIS — M79642 Pain in left hand: Secondary | ICD-10-CM

## 2019-07-06 DIAGNOSIS — Z7901 Long term (current) use of anticoagulants: Secondary | ICD-10-CM

## 2019-07-06 LAB — URIC ACID: Uric Acid, Serum: 7.1 mg/dL (ref 4.0–8.0)

## 2019-07-06 MED ORDER — PREDNISONE 5 MG (21) PO TBPK: ORAL_TABLET | ORAL | 0 refills | Status: DC

## 2019-07-06 NOTE — Progress Notes (Signed)
Subjective:    Patient ID: [name], male    DOB: 1940/12/14, 78 y.o.   MRN: [id number]  HPI He has history of left wrist pain and swelling. He has more swelling laterally and has numbness in ulnar nerve distribution.  He has no trauma.  He has been using a wrist cock-up splint to control the pain.  He saw Dr. Gerarda Fraction at [facility] recently.  He was given pain medicine and an injection.  He was referred here.  He has no other joint pain.  He has no history of gout in the family.  He is better now and most of the swelling has gone done.    He is on Coumadin.   Review of Systems  Constitutional: Positive for activity change.  Cardiovascular: Positive for chest pain and palpitations.  Musculoskeletal: Positive for arthralgias, back pain, joint swelling and myalgias.  All other systems reviewed and are negative.  For Review of Systems, all other systems reviewed and are negative.  The following is a summary of the past history medically, past history surgically, known current medicines, social history and family history.  This information is gathered electronically by the computer from prior information and documentation.  I review this each visit and have found including this information at this point in the chart is beneficial and informative.   Past Medical History:  Diagnosis Date  . Arthritis   . Atrial fibrillation ([place])   . Cardiac pacemaker in situ   . Coronary artery disease due to lipid rich plaque    history of coronary artery bypass grafting at [facility] 2007  . Dysrhythmia   . Hyperlipidemia   . Hypertension   . Hypothyroidism   . Presence of permanent cardiac pacemaker     Past Surgical History:  Procedure Laterality Date  . BACK SURGERY    . CATARACT EXTRACTION W/ INTRAOCULAR LENS IMPLANT Bilateral   . CORONARY ARTERY BYPASS GRAFT    . FRACTURE SURGERY Left   . HERNIA REPAIR    . HIP FRACTURE SURGERY    . INSERT / REPLACE / REMOVE  PACEMAKER    . KNEE ARTHROPLASTY Left 12/22/2017   Procedure: COMPUTER ASSISTED TOTAL KNEE ARTHROPLASTY;  Surgeon: Dereck Leep, MD;  Location: [facility] ORS;  Service: Orthopedics;  Laterality: Left;  . KNEE CLOSED REDUCTION Left 02/06/2018   Procedure: CLOSED MANIPULATION KNEE;  Surgeon: Dereck Leep, MD;  Location: [facility] ORS;  Service: Orthopedics;  Laterality: Left;  . open heart surgery    . 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 VENTRICULAR CARDIAC PACEMAKER INSERTION      Current Outpatient Medications on File Prior to Visit  Medication Sig Dispense Refill  . amLODipine (NORVASC) 10 MG tablet Take 10 mg by mouth daily.     . fluorouracil (EFUDEX) 5 % cream Apply 1 application topically 2 (two) times daily as needed for skin breakdown.    . levothyroxine (SYNTHROID, LEVOTHROID) 25 MCG tablet Take 25 mcg by mouth daily before breakfast.    . lisinopril (PRINIVIL,ZESTRIL) 20 MG tablet Take 20 mg by mouth daily.     . metoprolol succinate (TOPROL-XL) 25 MG 24 hr tablet Take 25 mg by mouth daily.     . mupirocin cream (BACTROBAN) 2 % Apply 1 application topically 2 (two) times daily. (Patient not taking: Reported on 02/03/2018) 15 g 0  . omeprazole (PRILOSEC) 40 MG capsule Take 40 mg by mouth daily as needed (for heartburn/indigestion).     Marland Kitchen  oxyCODONE (OXY IR/ROXICODONE) 5 MG immediate release tablet Take 1 tablet (5 mg total) by mouth every 4 (four) hours as needed for moderate pain (pain score 4-6). 30 tablet 0  . simvastatin (ZOCOR) 20 MG tablet Take 20 mg by mouth every evening.     . sotalol (BETAPACE) 120 MG tablet Take 120 mg by mouth 2 (two) times daily.     Marland Kitchen tiZANidine (ZANAFLEX) 4 MG tablet Take 1 tablet by mouth 3 (three) times daily.    . traMADol (ULTRAM) 50 MG tablet Take 1-2 tablets (50-100 mg total) by mouth every 4 (four) hours as needed for moderate pain. (Patient not taking: Reported on 02/03/2018) 60 tablet 0  . warfarin (COUMADIN) 5 MG tablet Take 5 mg by mouth daily.      No  current facility-administered medications on file prior to visit.     Social History   Socioeconomic History  . Marital status: Married    Spouse name: Not on file  . Number of children: Not on file  . Years of education: Not on file  . Highest education level: Not on file  Occupational History  . Not on file  Social Needs  . Financial resource strain: Not on file  . Food insecurity    Worry: Not on file    Inability: Not on file  . Transportation needs    Medical: Not on file    Non-medical: Not on file  Tobacco Use  . Smoking status: Never Smoker  . Smokeless tobacco: Never Used  Substance and Sexual Activity  . Alcohol use: No  . Drug use: No  . Sexual activity: Not on file  Lifestyle  . Physical activity    Days per week: Not on file    Minutes per session: Not on file  . Stress: Not on file  Relationships  . Social Herbalist on phone: Not on file    Gets together: Not on file    Attends religious service: Not on file    Active member of club or organization: Not on file    Attends meetings of clubs or organizations: Not on file    Relationship status: Not on file  . Intimate partner violence    Fear of current or ex partner: Not on file    Emotionally abused: Not on file    Physically abused: Not on file    Forced sexual activity: Not on file  Other Topics Concern  . Not on file  Social History Narrative  . Not on file    Family History  Problem Relation Age of Onset  . Heart failure Mother   . Heart disease Mother   . Cancer Father     BP 138/74   Pulse 67   Ht 5\' 6"  (1.676 m)   Wt 149 lb (67.6 kg)   BMI 24.05 kg/m   Body mass index is 24.05 kg/m.      Objective:   Physical Exam Vitals signs reviewed.  Constitutional:      Appearance: He is well-developed.  HENT:     Head: Normocephalic and atraumatic.  Eyes:     Conjunctiva/sclera: Conjunctivae normal.     Pupils: Pupils are equal, round, and reactive to light.  Neck:      Musculoskeletal: Normal range of motion and neck supple.  Cardiovascular:     Rate and Rhythm: Normal rate and regular rhythm.  Pulmonary:     Effort: Pulmonary effort is normal.  Abdominal:     Palpations: Abdomen is soft.  Musculoskeletal:     Left wrist: He exhibits decreased range of motion, tenderness and swelling.       Arms:  Skin:    General: Skin is warm and dry.  Neurological:     Mental Status: He is alert and oriented to person, place, and time.     Cranial Nerves: No cranial nerve deficit.     Motor: No abnormal muscle tone.     Coordination: Coordination normal.     Deep Tendon Reflexes: Reflexes are normal and symmetric. Reflexes normal.  Psychiatric:        Behavior: Behavior normal.        Thought Content: Thought content normal.        Judgment: Judgment normal.      I have reviewed the notes from [place], X-ray report and X-rays of the left wrist.     Assessment & Plan:   Encounter Diagnoses  Name Primary?  . Pain in left hand Yes  . Anticoagulated on Coumadin    I am concerned about possible gout and ulnar nerve tardy.  I will get EMGs  I will get serum uric acid  Return in one week.  Begin prednisone dose pack.  Call if any problem.  Precautions discussed.   Electronically Signed Sanjuana Kava, MD 11/24/20208:28 AM

## 2019-07-07 ENCOUNTER — Telehealth: Payer: Self-pay | Admitting: Orthopaedic Surgery

## 2019-07-07 NOTE — Telephone Encounter (Signed)
Send gout medication to [facility] on [address].

## 2019-07-12 ENCOUNTER — Telehealth: Payer: Self-pay

## 2019-07-12 MED ORDER — ALLOPURINOL 300 MG PO TABS: 300.0000 mg | ORAL_TABLET | Freq: Every day | ORAL | 5 refills | Status: DC

## 2019-07-12 NOTE — Telephone Encounter (Signed)
Do not take mine.  I called in the allopurinol.  He will need to monitor his blood thinning and let Dr. Gerarda Fraction he is on the allopurinol.

## 2019-07-12 NOTE — Telephone Encounter (Signed)
Patient called stating that he took his last Prednisone pill this morning, but this prednisone was prescribed by Dr. Gerarda Fraction. Patient is asking what should he do about your prescription, take it or what. I advised him to wait until I contact him back by in the morning after you advise.

## 2019-07-13 ENCOUNTER — Ambulatory Visit: Payer: Medicare Other | Admitting: Orthopaedic Surgery

## 2019-07-13 ENCOUNTER — Telehealth: Payer: Self-pay

## 2019-07-13 NOTE — Telephone Encounter (Signed)
Spoke with patient and relayed information that Dr. Luna Glasgow had said in his reply on previous message.

## 2019-07-15 ENCOUNTER — Other Ambulatory Visit: Payer: Self-pay

## 2019-07-15 ENCOUNTER — Encounter: Payer: Self-pay | Admitting: Orthopaedic Surgery

## 2019-07-15 ENCOUNTER — Telehealth: Payer: Self-pay | Admitting: *Deleted

## 2019-07-15 ENCOUNTER — Ambulatory Visit (INDEPENDENT_AMBULATORY_CARE_PROVIDER_SITE_OTHER): Payer: Medicare Other | Admitting: Orthopaedic Surgery

## 2019-07-15 VITALS — BP 116/70 | HR 83 | Ht 66.0 in | Wt 149.0 lb

## 2019-07-15 DIAGNOSIS — Z7901 Long term (current) use of anticoagulants: Secondary | ICD-10-CM

## 2019-07-15 DIAGNOSIS — M79642 Pain in left hand: Secondary | ICD-10-CM | POA: Diagnosis not present

## 2019-07-15 NOTE — Addendum Note (Signed)
Addended byCandice Camp on: 07/15/2019 10:51 AM   Modules accepted: Orders

## 2019-07-15 NOTE — Progress Notes (Signed)
Patient [name], male DOB:06-26-1941, 78 y.o. [id number]  Chief Complaint  Patient presents with  . Hand Pain    left/ better    HPI  [name] is a 78 y.o. male who is much better with his left hand pain and swelling.  I called in allopurinol but he had not started on it yet.  He wants to cancel the EMGs.  I will do this.  He has less pain, no weakness.  Uric acid was 7.1.   Body mass index is 24.05 kg/m.  ROS  Review of Systems  Constitutional: Positive for activity change.  Cardiovascular: Positive for chest pain and palpitations.  Musculoskeletal: Positive for arthralgias, back pain, joint swelling and myalgias.  Psychiatric/Behavioral: Behavioral problem: .dx.  All other systems reviewed and are negative.   All other systems reviewed and are negative.  The following is a summary of the past history medically, past history surgically, known current medicines, social history and family history.  This information is gathered electronically by the computer from prior information and documentation.  I review this each visit and have found including this information at this point in the chart is beneficial and informative.    Past Medical History:  Diagnosis Date  . Arthritis   . Atrial fibrillation ([place])   . Cardiac pacemaker in situ   . Coronary artery disease due to lipid rich plaque    history of coronary artery bypass grafting at [facility] 2007  . Dysrhythmia   . Hyperlipidemia   . Hypertension   . Hypothyroidism   . Presence of permanent cardiac pacemaker     Past Surgical History:  Procedure Laterality Date  . BACK SURGERY    . CATARACT EXTRACTION W/ INTRAOCULAR LENS IMPLANT Bilateral   . CORONARY ARTERY BYPASS GRAFT    . FRACTURE SURGERY Left   . HERNIA REPAIR    . HIP FRACTURE SURGERY    . INSERT / REPLACE / REMOVE PACEMAKER    . KNEE ARTHROPLASTY Left 12/22/2017   Procedure: COMPUTER ASSISTED TOTAL KNEE  ARTHROPLASTY;  Surgeon: Dereck Leep, MD;  Location: [facility] ORS;  Service: Orthopedics;  Laterality: Left;  . KNEE CLOSED REDUCTION Left 02/06/2018   Procedure: CLOSED MANIPULATION KNEE;  Surgeon: Dereck Leep, MD;  Location: [facility] ORS;  Service: Orthopedics;  Laterality: Left;  . open heart surgery    . 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 VENTRICULAR CARDIAC PACEMAKER INSERTION      Family History  Problem Relation Age of Onset  . Heart failure Mother   . Heart disease Mother   . Cancer Father     Social History Social History   Tobacco Use  . Smoking status: Never Smoker  . Smokeless tobacco: Never Used  Substance Use Topics  . Alcohol use: No  . Drug use: No    No Known Allergies  Current Outpatient Medications  Medication Sig Dispense Refill  . amLODipine (NORVASC) 10 MG tablet Take 10 mg by mouth daily.     . fluorouracil (EFUDEX) 5 % cream Apply 1 application topically 2 (two) times daily as needed for skin breakdown.    . levothyroxine (SYNTHROID, LEVOTHROID) 25 MCG tablet Take 25 mcg by mouth daily before breakfast.    . lisinopril (PRINIVIL,ZESTRIL) 20 MG tablet Take 20 mg by mouth daily.     . metoprolol succinate (TOPROL-XL) 25 MG 24 hr tablet Take 25 mg by mouth daily.     . mupirocin  cream (BACTROBAN) 2 % Apply 1 application topically 2 (two) times daily. 15 g 0  . omeprazole (PRILOSEC) 40 MG capsule Take 40 mg by mouth daily as needed (for heartburn/indigestion).     Marland Kitchen oxyCODONE (OXY IR/ROXICODONE) 5 MG immediate release tablet Take 1 tablet (5 mg total) by mouth every 4 (four) hours as needed for moderate pain (pain score 4-6). 30 tablet 0  . predniSONE (STERAPRED UNI-PAK 21 TAB) 5 MG (21) TBPK tablet Take 6 pills first day; 5 pills second day; 4 pills third day; 3 pills fourth day; 2 pills next day and 1 pill last day. 21 tablet 0  . simvastatin (ZOCOR) 20 MG tablet Take 20 mg by mouth every evening.     . sotalol (BETAPACE) 120 MG tablet Take 120 mg by mouth 2  (two) times daily.     Marland Kitchen tiZANidine (ZANAFLEX) 4 MG tablet Take 1 tablet by mouth 3 (three) times daily.    . traMADol (ULTRAM) 50 MG tablet Take 1-2 tablets (50-100 mg total) by mouth every 4 (four) hours as needed for moderate pain. 60 tablet 0  . warfarin (COUMADIN) 5 MG tablet Take 5 mg by mouth daily.     Marland Kitchen allopurinol (ZYLOPRIM) 300 MG tablet Take 1 tablet (300 mg total) by mouth daily. (Patient not taking: Reported on 07/15/2019) 30 tablet 5   No current facility-administered medications for this visit.      Physical Exam  Blood pressure 116/70, pulse 83, height 5\' 6"  (1.676 m), weight 149 lb (67.6 kg).  Constitutional: overall normal hygiene, normal nutrition, well developed, normal grooming, normal body habitus. Assistive device:none  Musculoskeletal: gait and station Limp none, muscle tone and strength are normal, no tremors or atrophy is present.  .  Neurological: coordination overall normal.  Deep tendon reflex/nerve stretch intact.  Sensation normal.  Cranial nerves II-XII intact.   Skin:   Normal overall no scars, lesions, ulcers or rashes. No psoriasis.  Psychiatric: Alert and oriented x 3.  Recent memory intact, remote memory unclear.  Normal mood and affect. Well groomed.  Good eye contact.  Cardiovascular: overall no swelling, no varicosities, no edema bilaterally, normal temperatures of the legs and arms, no clubbing, cyanosis and good capillary refill.  Lymphatic: palpation is normal.  He has no swelling of the left hand.  He has some slight weakness of the left ulnar nerve.    All other systems reviewed and are negative   The patient has been educated about the nature of the problem(s) and counseled on treatment options.  The patient appeared to understand what I have discussed and is in agreement with it.  Encounter Diagnoses  Name Primary?  . Pain in left hand Yes  . Anticoagulated on Coumadin     PLAN Call if any problems.  Precautions discussed.   Continue current medications.   Return to clinic 1 month   Electronically Signed Sanjuana Kava, MD 12/3/202010:53 AM

## 2019-07-15 NOTE — Telephone Encounter (Signed)
Pt called and states he is feeling much better and has decided to cancel appt 07/30/2019, appt cancelled.

## 2019-07-26 DIAGNOSIS — Z7901 Long term (current) use of anticoagulants: Secondary | ICD-10-CM | POA: Diagnosis not present

## 2019-07-30 ENCOUNTER — Encounter: Payer: Medicare Other | Admitting: Physical Medicine and Rehabilitation

## 2019-08-09 DIAGNOSIS — M2550 Pain in unspecified joint: Secondary | ICD-10-CM | POA: Diagnosis not present

## 2019-08-09 DIAGNOSIS — G5602 Carpal tunnel syndrome, left upper limb: Secondary | ICD-10-CM | POA: Diagnosis not present

## 2019-08-09 DIAGNOSIS — Z6824 Body mass index (BMI) 24.0-24.9, adult: Secondary | ICD-10-CM | POA: Diagnosis not present

## 2019-08-09 DIAGNOSIS — G894 Chronic pain syndrome: Secondary | ICD-10-CM | POA: Diagnosis not present

## 2019-08-10 ENCOUNTER — Encounter: Payer: Self-pay | Admitting: Orthopaedic Surgery

## 2019-08-10 ENCOUNTER — Other Ambulatory Visit: Payer: Self-pay

## 2019-08-10 ENCOUNTER — Ambulatory Visit: Payer: Medicare Other | Admitting: Orthopaedic Surgery

## 2019-08-10 VITALS — BP 124/71 | HR 73 | Ht 66.0 in | Wt 149.0 lb

## 2019-08-10 DIAGNOSIS — M1A00X0 Idiopathic chronic gout, unspecified site, without tophus (tophi): Secondary | ICD-10-CM

## 2019-08-10 NOTE — Progress Notes (Signed)
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  He is doing well.  He is to continue it daily.  Return as needed.  Call if any problem.  Precautions discussed.   Electronically Signed Sanjuana Kava, MD 12/29/20208:34 AM

## 2019-08-12 ENCOUNTER — Ambulatory Visit: Payer: Medicare Other | Admitting: Orthopaedic Surgery

## 2019-08-12 DIAGNOSIS — N182 Chronic kidney disease, stage 2 (mild): Secondary | ICD-10-CM | POA: Diagnosis not present

## 2019-08-12 DIAGNOSIS — I2510 Atherosclerotic heart disease of native coronary artery without angina pectoris: Secondary | ICD-10-CM | POA: Diagnosis not present

## 2019-08-12 DIAGNOSIS — E1122 Type 2 diabetes mellitus with diabetic chronic kidney disease: Secondary | ICD-10-CM | POA: Diagnosis not present

## 2019-08-12 DIAGNOSIS — I129 Hypertensive chronic kidney disease with stage 1 through stage 4 chronic kidney disease, or unspecified chronic kidney disease: Secondary | ICD-10-CM | POA: Diagnosis not present

## 2019-08-23 DIAGNOSIS — Z7901 Long term (current) use of anticoagulants: Secondary | ICD-10-CM | POA: Diagnosis not present

## 2019-08-31 DIAGNOSIS — I5022 Chronic systolic (congestive) heart failure: Secondary | ICD-10-CM | POA: Diagnosis not present

## 2019-09-07 DIAGNOSIS — I25738 Atherosclerosis of nonautologous biological coronary artery bypass graft(s) with other forms of angina pectoris: Secondary | ICD-10-CM | POA: Diagnosis not present

## 2019-09-07 DIAGNOSIS — I5022 Chronic systolic (congestive) heart failure: Secondary | ICD-10-CM | POA: Diagnosis not present

## 2019-09-07 DIAGNOSIS — I4891 Unspecified atrial fibrillation: Secondary | ICD-10-CM | POA: Diagnosis not present

## 2019-09-07 DIAGNOSIS — I10 Essential (primary) hypertension: Secondary | ICD-10-CM | POA: Diagnosis not present

## 2019-09-07 DIAGNOSIS — I482 Chronic atrial fibrillation, unspecified: Secondary | ICD-10-CM | POA: Diagnosis not present

## 2019-09-12 DIAGNOSIS — I129 Hypertensive chronic kidney disease with stage 1 through stage 4 chronic kidney disease, or unspecified chronic kidney disease: Secondary | ICD-10-CM | POA: Diagnosis not present

## 2019-09-12 DIAGNOSIS — N182 Chronic kidney disease, stage 2 (mild): Secondary | ICD-10-CM | POA: Diagnosis not present

## 2019-09-12 DIAGNOSIS — E1122 Type 2 diabetes mellitus with diabetic chronic kidney disease: Secondary | ICD-10-CM | POA: Diagnosis not present

## 2019-09-12 DIAGNOSIS — I2510 Atherosclerotic heart disease of native coronary artery without angina pectoris: Secondary | ICD-10-CM | POA: Diagnosis not present

## 2019-09-23 DIAGNOSIS — K219 Gastro-esophageal reflux disease without esophagitis: Secondary | ICD-10-CM | POA: Diagnosis not present

## 2019-09-23 DIAGNOSIS — M159 Polyosteoarthritis, unspecified: Secondary | ICD-10-CM | POA: Diagnosis not present

## 2019-09-23 DIAGNOSIS — N182 Chronic kidney disease, stage 2 (mild): Secondary | ICD-10-CM | POA: Diagnosis not present

## 2019-09-23 DIAGNOSIS — Z1389 Encounter for screening for other disorder: Secondary | ICD-10-CM | POA: Diagnosis not present

## 2019-09-23 DIAGNOSIS — I4891 Unspecified atrial fibrillation: Secondary | ICD-10-CM | POA: Diagnosis not present

## 2019-09-23 DIAGNOSIS — G894 Chronic pain syndrome: Secondary | ICD-10-CM | POA: Diagnosis not present

## 2019-09-23 DIAGNOSIS — Z0001 Encounter for general adult medical examination with abnormal findings: Secondary | ICD-10-CM | POA: Diagnosis not present

## 2019-09-27 DIAGNOSIS — Z1389 Encounter for screening for other disorder: Secondary | ICD-10-CM | POA: Diagnosis not present

## 2019-09-27 DIAGNOSIS — Z7901 Long term (current) use of anticoagulants: Secondary | ICD-10-CM | POA: Diagnosis not present

## 2019-09-27 DIAGNOSIS — E063 Autoimmune thyroiditis: Secondary | ICD-10-CM | POA: Diagnosis not present

## 2019-09-27 DIAGNOSIS — E7849 Other hyperlipidemia: Secondary | ICD-10-CM | POA: Diagnosis not present

## 2019-09-27 DIAGNOSIS — Z0000 Encounter for general adult medical examination without abnormal findings: Secondary | ICD-10-CM | POA: Diagnosis not present

## 2019-10-10 DIAGNOSIS — I129 Hypertensive chronic kidney disease with stage 1 through stage 4 chronic kidney disease, or unspecified chronic kidney disease: Secondary | ICD-10-CM | POA: Diagnosis not present

## 2019-10-10 DIAGNOSIS — I2510 Atherosclerotic heart disease of native coronary artery without angina pectoris: Secondary | ICD-10-CM | POA: Diagnosis not present

## 2019-10-10 DIAGNOSIS — N182 Chronic kidney disease, stage 2 (mild): Secondary | ICD-10-CM | POA: Diagnosis not present

## 2019-10-10 DIAGNOSIS — E1122 Type 2 diabetes mellitus with diabetic chronic kidney disease: Secondary | ICD-10-CM | POA: Diagnosis not present

## 2019-10-21 DIAGNOSIS — Z7901 Long term (current) use of anticoagulants: Secondary | ICD-10-CM | POA: Diagnosis not present

## 2019-11-10 DIAGNOSIS — N182 Chronic kidney disease, stage 2 (mild): Secondary | ICD-10-CM | POA: Diagnosis not present

## 2019-11-10 DIAGNOSIS — I129 Hypertensive chronic kidney disease with stage 1 through stage 4 chronic kidney disease, or unspecified chronic kidney disease: Secondary | ICD-10-CM | POA: Diagnosis not present

## 2019-11-10 DIAGNOSIS — E1122 Type 2 diabetes mellitus with diabetic chronic kidney disease: Secondary | ICD-10-CM | POA: Diagnosis not present

## 2019-11-22 DIAGNOSIS — E063 Autoimmune thyroiditis: Secondary | ICD-10-CM | POA: Diagnosis not present

## 2019-11-22 DIAGNOSIS — G894 Chronic pain syndrome: Secondary | ICD-10-CM | POA: Diagnosis not present

## 2019-11-22 DIAGNOSIS — I4891 Unspecified atrial fibrillation: Secondary | ICD-10-CM | POA: Diagnosis not present

## 2019-11-22 DIAGNOSIS — M2550 Pain in unspecified joint: Secondary | ICD-10-CM | POA: Diagnosis not present

## 2019-11-22 DIAGNOSIS — M1991 Primary osteoarthritis, unspecified site: Secondary | ICD-10-CM | POA: Diagnosis not present

## 2019-12-02 DIAGNOSIS — L570 Actinic keratosis: Secondary | ICD-10-CM | POA: Diagnosis not present

## 2019-12-02 DIAGNOSIS — D0461 Carcinoma in situ of skin of right upper limb, including shoulder: Secondary | ICD-10-CM | POA: Diagnosis not present

## 2019-12-02 DIAGNOSIS — D225 Melanocytic nevi of trunk: Secondary | ICD-10-CM | POA: Diagnosis not present

## 2019-12-02 DIAGNOSIS — D0462 Carcinoma in situ of skin of left upper limb, including shoulder: Secondary | ICD-10-CM | POA: Diagnosis not present

## 2019-12-02 DIAGNOSIS — X32XXXA Exposure to sunlight, initial encounter: Secondary | ICD-10-CM | POA: Diagnosis not present

## 2019-12-10 DIAGNOSIS — E1122 Type 2 diabetes mellitus with diabetic chronic kidney disease: Secondary | ICD-10-CM | POA: Diagnosis not present

## 2019-12-10 DIAGNOSIS — M159 Polyosteoarthritis, unspecified: Secondary | ICD-10-CM | POA: Diagnosis not present

## 2019-12-10 DIAGNOSIS — N182 Chronic kidney disease, stage 2 (mild): Secondary | ICD-10-CM | POA: Diagnosis not present

## 2019-12-10 DIAGNOSIS — I129 Hypertensive chronic kidney disease with stage 1 through stage 4 chronic kidney disease, or unspecified chronic kidney disease: Secondary | ICD-10-CM | POA: Diagnosis not present

## 2019-12-22 DIAGNOSIS — Z7901 Long term (current) use of anticoagulants: Secondary | ICD-10-CM | POA: Diagnosis not present

## 2019-12-22 DIAGNOSIS — M1A00X0 Idiopathic chronic gout, unspecified site, without tophus (tophi): Secondary | ICD-10-CM | POA: Diagnosis not present

## 2019-12-22 DIAGNOSIS — G894 Chronic pain syndrome: Secondary | ICD-10-CM | POA: Diagnosis not present

## 2019-12-22 DIAGNOSIS — M1991 Primary osteoarthritis, unspecified site: Secondary | ICD-10-CM | POA: Diagnosis not present

## 2020-01-06 ENCOUNTER — Ambulatory Visit: Payer: Medicare Other | Admitting: Orthopaedic Surgery

## 2020-01-06 ENCOUNTER — Other Ambulatory Visit: Payer: Self-pay

## 2020-01-06 ENCOUNTER — Encounter: Payer: Self-pay | Admitting: Orthopaedic Surgery

## 2020-01-06 ENCOUNTER — Ambulatory Visit: Payer: Medicare Other

## 2020-01-06 VITALS — BP 129/58 | HR 63 | Ht 66.0 in | Wt 151.0 lb

## 2020-01-06 DIAGNOSIS — M25562 Pain in left knee: Secondary | ICD-10-CM

## 2020-01-06 DIAGNOSIS — G8929 Other chronic pain: Secondary | ICD-10-CM | POA: Diagnosis not present

## 2020-01-06 DIAGNOSIS — Z8781 Personal history of (healed) traumatic fracture: Secondary | ICD-10-CM | POA: Insufficient documentation

## 2020-01-06 DIAGNOSIS — Z9289 Personal history of other medical treatment: Secondary | ICD-10-CM | POA: Insufficient documentation

## 2020-01-06 MED ORDER — ALLOPURINOL 300 MG PO TABS: 300.0000 mg | ORAL_TABLET | Freq: Every day | ORAL | 5 refills | Status: DC

## 2020-01-06 NOTE — Progress Notes (Signed)
Patient [name], male DOB:12-Feb-1941, 79 y.o. FO:[id number]  Chief Complaint  Patient presents with  . Knee Pain    left     HPI  [name] is a 79 y.o. male who has a total knee on the left and fell and twisted his knee last week.  He has had some pain and swelling.  He takes Coumadin daily.  He has no ecchymosis.  He also has gout and is out of his medicine.  I will refill the allopurinol.  He has no other injury.   Body mass index is 24.37 kg/m.  ROS  Review of Systems  Constitutional: Positive for activity change.  Cardiovascular: Positive for chest pain and palpitations.  Musculoskeletal: Positive for arthralgias, back pain, joint swelling and myalgias.  Psychiatric/Behavioral: Behavioral problem: .dx.  All other systems reviewed and are negative.   All other systems reviewed and are negative.  The following is a summary of the past history medically, past history surgically, known current medicines, social history and family history.  This information is gathered electronically by the computer from prior information and documentation.  I review this each visit and have found including this information at this point in the chart is beneficial and informative.    Past Medical History:  Diagnosis Date  . Arthritis   . Atrial fibrillation ([place])   . Cardiac pacemaker in situ   . Coronary artery disease due to lipid rich plaque    history of coronary artery bypass grafting at [facility] 2007  . Dysrhythmia   . Hyperlipidemia   . Hypertension   . Hypothyroidism   . Presence of permanent cardiac pacemaker     Past Surgical History:  Procedure Laterality Date  . BACK SURGERY    . CATARACT EXTRACTION W/ INTRAOCULAR LENS IMPLANT Bilateral   . CORONARY ARTERY BYPASS GRAFT    . FRACTURE SURGERY Left   . HERNIA REPAIR    . HIP FRACTURE SURGERY    . INSERT / REPLACE / REMOVE PACEMAKER    . KNEE ARTHROPLASTY Left 12/22/2017   Procedure:  COMPUTER ASSISTED TOTAL KNEE ARTHROPLASTY;  Surgeon: Dereck Leep, MD;  Location: [facility] ORS;  Service: Orthopedics;  Laterality: Left;  . KNEE CLOSED REDUCTION Left 02/06/2018   Procedure: CLOSED MANIPULATION KNEE;  Surgeon: Dereck Leep, MD;  Location: [facility] ORS;  Service: Orthopedics;  Laterality: Left;  . open heart surgery    . 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 VENTRICULAR CARDIAC PACEMAKER INSERTION      Family History  Problem Relation Age of Onset  . Heart failure Mother   . Heart disease Mother   . Cancer Father     Social History Social History   Tobacco Use  . Smoking status: Never Smoker  . Smokeless tobacco: Never Used  Substance Use Topics  . Alcohol use: No  . Drug use: No    No Known Allergies  Current Outpatient Medications  Medication Sig Dispense Refill  . allopurinol (ZYLOPRIM) 300 MG tablet Take 1 tablet (300 mg total) by mouth daily. 30 tablet 5  . amLODipine (NORVASC) 10 MG tablet Take 10 mg by mouth daily.     . fluorouracil (EFUDEX) 5 % cream Apply 1 application topically 2 (two) times daily as needed for skin breakdown.    . levothyroxine (SYNTHROID, LEVOTHROID) 25 MCG tablet Take 25 mcg by mouth daily before breakfast.    . lisinopril (PRINIVIL,ZESTRIL) 20 MG tablet Take 20 mg  by mouth daily.     . metoprolol succinate (TOPROL-XL) 25 MG 24 hr tablet Take 25 mg by mouth daily.     . mupirocin cream (BACTROBAN) 2 % Apply 1 application topically 2 (two) times daily. 15 g 0  . omeprazole (PRILOSEC) 40 MG capsule Take 40 mg by mouth daily as needed (for heartburn/indigestion).     Marland Kitchen oxyCODONE (OXY IR/ROXICODONE) 5 MG immediate release tablet Take 1 tablet (5 mg total) by mouth every 4 (four) hours as needed for moderate pain (pain score 4-6). 30 tablet 0  . simvastatin (ZOCOR) 20 MG tablet Take 20 mg by mouth every evening.     . sotalol (BETAPACE) 120 MG tablet Take 120 mg by mouth 2 (two) times daily.     Marland Kitchen tiZANidine (ZANAFLEX) 4 MG tablet Take 1 tablet  by mouth 3 (three) times daily.    . traMADol (ULTRAM) 50 MG tablet Take 1-2 tablets (50-100 mg total) by mouth every 4 (four) hours as needed for moderate pain. 60 tablet 0  . warfarin (COUMADIN) 5 MG tablet Take 5 mg by mouth daily.      No current facility-administered medications for this visit.     Physical Exam  Blood pressure (!) 129/58, pulse 63, height 5\' 6"  (1.676 m), weight 151 lb (68.5 kg).  Constitutional: overall normal hygiene, normal nutrition, well developed, normal grooming, normal body habitus. Assistive device:none  Musculoskeletal: gait and station Limp none, muscle tone and strength are normal, no tremors or atrophy is present.  .  Neurological: coordination overall normal.  Deep tendon reflex/nerve stretch intact.  Sensation normal.  Cranial nerves II-XII intact.   Skin:   Normal overall no scars, lesions, ulcers or rashes. No psoriasis.  Psychiatric: Alert and oriented x 3.  Recent memory intact, remote memory unclear.  Normal mood and affect. Well groomed.  Good eye contact.  Cardiovascular: overall no swelling, no varicosities, no edema bilaterally, normal temperatures of the legs and arms, no clubbing, cyanosis and good capillary refill.  Lymphatic: palpation is normal.  Left knee with well healed scar, full ROM.  He has slight effusion.  Knee is stable.  No limp.  NV intact. All other systems reviewed and are negative   X-rays were done of the left knee, reported separately.  The patient has been educated about the nature of the problem(s) and counseled on treatment options.  The patient appeared to understand what I have discussed and is in agreement with it.  Encounter Diagnosis  Name Primary?  . Chronic pain of left knee Yes   I feel he has injured knee from the twisting and has slight effusion most likely related to the Coumadin.  It should resolve with no need for aspiration.  X-rays showed no fracture.  PLAN Call if any problems.  Precautions  discussed.  Continue current medications. I will call in refill on allopurinol.  Return to clinic prn   Electronically Signed Sanjuana Kava, MD 5/27/20218:58 AM

## 2020-01-10 DIAGNOSIS — M159 Polyosteoarthritis, unspecified: Secondary | ICD-10-CM | POA: Diagnosis not present

## 2020-01-10 DIAGNOSIS — N182 Chronic kidney disease, stage 2 (mild): Secondary | ICD-10-CM | POA: Diagnosis not present

## 2020-01-10 DIAGNOSIS — I129 Hypertensive chronic kidney disease with stage 1 through stage 4 chronic kidney disease, or unspecified chronic kidney disease: Secondary | ICD-10-CM | POA: Diagnosis not present

## 2020-01-10 DIAGNOSIS — E1122 Type 2 diabetes mellitus with diabetic chronic kidney disease: Secondary | ICD-10-CM | POA: Diagnosis not present

## 2020-01-13 ENCOUNTER — Other Ambulatory Visit: Payer: Self-pay | Admitting: Orthopaedic Surgery

## 2020-01-13 NOTE — Telephone Encounter (Signed)
Rx request 

## 2020-01-24 DIAGNOSIS — G894 Chronic pain syndrome: Secondary | ICD-10-CM | POA: Diagnosis not present

## 2020-01-24 DIAGNOSIS — M2550 Pain in unspecified joint: Secondary | ICD-10-CM | POA: Diagnosis not present

## 2020-01-24 DIAGNOSIS — M1991 Primary osteoarthritis, unspecified site: Secondary | ICD-10-CM | POA: Diagnosis not present

## 2020-01-24 DIAGNOSIS — Z7901 Long term (current) use of anticoagulants: Secondary | ICD-10-CM | POA: Diagnosis not present

## 2020-01-24 DIAGNOSIS — I10 Essential (primary) hypertension: Secondary | ICD-10-CM | POA: Diagnosis not present

## 2020-02-09 DIAGNOSIS — M159 Polyosteoarthritis, unspecified: Secondary | ICD-10-CM | POA: Diagnosis not present

## 2020-02-09 DIAGNOSIS — N182 Chronic kidney disease, stage 2 (mild): Secondary | ICD-10-CM | POA: Diagnosis not present

## 2020-02-09 DIAGNOSIS — E1122 Type 2 diabetes mellitus with diabetic chronic kidney disease: Secondary | ICD-10-CM | POA: Diagnosis not present

## 2020-02-09 DIAGNOSIS — I129 Hypertensive chronic kidney disease with stage 1 through stage 4 chronic kidney disease, or unspecified chronic kidney disease: Secondary | ICD-10-CM | POA: Diagnosis not present

## 2020-02-22 DIAGNOSIS — Z7901 Long term (current) use of anticoagulants: Secondary | ICD-10-CM | POA: Diagnosis not present

## 2020-02-29 DIAGNOSIS — I5022 Chronic systolic (congestive) heart failure: Secondary | ICD-10-CM | POA: Diagnosis not present

## 2020-03-08 DIAGNOSIS — I482 Chronic atrial fibrillation, unspecified: Secondary | ICD-10-CM | POA: Diagnosis not present

## 2020-03-08 DIAGNOSIS — I5022 Chronic systolic (congestive) heart failure: Secondary | ICD-10-CM | POA: Diagnosis not present

## 2020-03-08 DIAGNOSIS — I4891 Unspecified atrial fibrillation: Secondary | ICD-10-CM | POA: Diagnosis not present

## 2020-03-08 DIAGNOSIS — I25738 Atherosclerosis of nonautologous biological coronary artery bypass graft(s) with other forms of angina pectoris: Secondary | ICD-10-CM | POA: Diagnosis not present

## 2020-03-08 DIAGNOSIS — I10 Essential (primary) hypertension: Secondary | ICD-10-CM | POA: Diagnosis not present

## 2020-03-10 DIAGNOSIS — E1122 Type 2 diabetes mellitus with diabetic chronic kidney disease: Secondary | ICD-10-CM | POA: Diagnosis not present

## 2020-03-10 DIAGNOSIS — M159 Polyosteoarthritis, unspecified: Secondary | ICD-10-CM | POA: Diagnosis not present

## 2020-03-10 DIAGNOSIS — N182 Chronic kidney disease, stage 2 (mild): Secondary | ICD-10-CM | POA: Diagnosis not present

## 2020-03-10 DIAGNOSIS — I129 Hypertensive chronic kidney disease with stage 1 through stage 4 chronic kidney disease, or unspecified chronic kidney disease: Secondary | ICD-10-CM | POA: Diagnosis not present

## 2020-03-24 DIAGNOSIS — E119 Type 2 diabetes mellitus without complications: Secondary | ICD-10-CM | POA: Diagnosis not present

## 2020-03-24 DIAGNOSIS — M109 Gout, unspecified: Secondary | ICD-10-CM | POA: Diagnosis not present

## 2020-03-24 DIAGNOSIS — Z6824 Body mass index (BMI) 24.0-24.9, adult: Secondary | ICD-10-CM | POA: Diagnosis not present

## 2020-03-24 DIAGNOSIS — G894 Chronic pain syndrome: Secondary | ICD-10-CM | POA: Diagnosis not present

## 2020-03-24 DIAGNOSIS — M1991 Primary osteoarthritis, unspecified site: Secondary | ICD-10-CM | POA: Diagnosis not present

## 2020-03-24 DIAGNOSIS — Z7901 Long term (current) use of anticoagulants: Secondary | ICD-10-CM | POA: Diagnosis not present

## 2020-04-11 DIAGNOSIS — N182 Chronic kidney disease, stage 2 (mild): Secondary | ICD-10-CM | POA: Diagnosis not present

## 2020-04-11 DIAGNOSIS — M159 Polyosteoarthritis, unspecified: Secondary | ICD-10-CM | POA: Diagnosis not present

## 2020-04-11 DIAGNOSIS — E1122 Type 2 diabetes mellitus with diabetic chronic kidney disease: Secondary | ICD-10-CM | POA: Diagnosis not present

## 2020-04-11 DIAGNOSIS — I129 Hypertensive chronic kidney disease with stage 1 through stage 4 chronic kidney disease, or unspecified chronic kidney disease: Secondary | ICD-10-CM | POA: Diagnosis not present

## 2020-04-24 DIAGNOSIS — Z7901 Long term (current) use of anticoagulants: Secondary | ICD-10-CM | POA: Diagnosis not present

## 2020-04-30 DIAGNOSIS — Z1159 Encounter for screening for other viral diseases: Secondary | ICD-10-CM | POA: Diagnosis not present

## 2020-04-30 DIAGNOSIS — Z20822 Contact with and (suspected) exposure to covid-19: Secondary | ICD-10-CM | POA: Diagnosis not present

## 2020-05-11 DIAGNOSIS — N182 Chronic kidney disease, stage 2 (mild): Secondary | ICD-10-CM | POA: Diagnosis not present

## 2020-05-11 DIAGNOSIS — E1122 Type 2 diabetes mellitus with diabetic chronic kidney disease: Secondary | ICD-10-CM | POA: Diagnosis not present

## 2020-05-11 DIAGNOSIS — M159 Polyosteoarthritis, unspecified: Secondary | ICD-10-CM | POA: Diagnosis not present

## 2020-05-24 DIAGNOSIS — Z7901 Long term (current) use of anticoagulants: Secondary | ICD-10-CM | POA: Diagnosis not present

## 2020-05-24 DIAGNOSIS — M1991 Primary osteoarthritis, unspecified site: Secondary | ICD-10-CM | POA: Diagnosis not present

## 2020-05-24 DIAGNOSIS — Z23 Encounter for immunization: Secondary | ICD-10-CM | POA: Diagnosis not present

## 2020-05-24 DIAGNOSIS — G894 Chronic pain syndrome: Secondary | ICD-10-CM | POA: Diagnosis not present

## 2020-05-24 DIAGNOSIS — I10 Essential (primary) hypertension: Secondary | ICD-10-CM | POA: Diagnosis not present

## 2020-05-24 DIAGNOSIS — Z6824 Body mass index (BMI) 24.0-24.9, adult: Secondary | ICD-10-CM | POA: Diagnosis not present

## 2020-06-23 DIAGNOSIS — Z79899 Other long term (current) drug therapy: Secondary | ICD-10-CM | POA: Diagnosis not present

## 2020-07-11 DIAGNOSIS — E1122 Type 2 diabetes mellitus with diabetic chronic kidney disease: Secondary | ICD-10-CM | POA: Diagnosis not present

## 2020-07-11 DIAGNOSIS — M159 Polyosteoarthritis, unspecified: Secondary | ICD-10-CM | POA: Diagnosis not present

## 2020-07-11 DIAGNOSIS — N182 Chronic kidney disease, stage 2 (mild): Secondary | ICD-10-CM | POA: Diagnosis not present

## 2020-07-21 DIAGNOSIS — M109 Gout, unspecified: Secondary | ICD-10-CM | POA: Diagnosis not present

## 2020-07-21 DIAGNOSIS — G894 Chronic pain syndrome: Secondary | ICD-10-CM | POA: Diagnosis not present

## 2020-07-21 DIAGNOSIS — I4891 Unspecified atrial fibrillation: Secondary | ICD-10-CM | POA: Diagnosis not present

## 2020-07-21 DIAGNOSIS — M1991 Primary osteoarthritis, unspecified site: Secondary | ICD-10-CM | POA: Diagnosis not present

## 2020-07-24 DIAGNOSIS — Z79899 Other long term (current) drug therapy: Secondary | ICD-10-CM | POA: Diagnosis not present

## 2020-08-11 DIAGNOSIS — N182 Chronic kidney disease, stage 2 (mild): Secondary | ICD-10-CM | POA: Diagnosis not present

## 2020-08-11 DIAGNOSIS — E1122 Type 2 diabetes mellitus with diabetic chronic kidney disease: Secondary | ICD-10-CM | POA: Diagnosis not present

## 2020-08-11 DIAGNOSIS — M159 Polyosteoarthritis, unspecified: Secondary | ICD-10-CM | POA: Diagnosis not present

## 2021-08-24 DIAGNOSIS — E1122 Type 2 diabetes mellitus with diabetic chronic kidney disease: Secondary | ICD-10-CM | POA: Diagnosis not present

## 2021-08-24 DIAGNOSIS — I10 Essential (primary) hypertension: Secondary | ICD-10-CM | POA: Diagnosis not present

## 2021-08-24 DIAGNOSIS — G894 Chronic pain syndrome: Secondary | ICD-10-CM | POA: Diagnosis not present

## 2021-08-24 DIAGNOSIS — E063 Autoimmune thyroiditis: Secondary | ICD-10-CM | POA: Diagnosis not present

## 2021-08-27 DIAGNOSIS — Z79899 Other long term (current) drug therapy: Secondary | ICD-10-CM | POA: Diagnosis not present

## 2021-09-03 DIAGNOSIS — E119 Type 2 diabetes mellitus without complications: Secondary | ICD-10-CM | POA: Diagnosis not present

## 2021-09-13 DIAGNOSIS — I482 Chronic atrial fibrillation, unspecified: Secondary | ICD-10-CM | POA: Diagnosis not present

## 2021-09-13 DIAGNOSIS — I639 Cerebral infarction, unspecified: Secondary | ICD-10-CM | POA: Diagnosis not present

## 2021-09-13 DIAGNOSIS — I739 Peripheral vascular disease, unspecified: Secondary | ICD-10-CM | POA: Diagnosis not present

## 2021-09-13 DIAGNOSIS — Z951 Presence of aortocoronary bypass graft: Secondary | ICD-10-CM | POA: Diagnosis not present

## 2021-09-13 DIAGNOSIS — I4891 Unspecified atrial fibrillation: Secondary | ICD-10-CM | POA: Diagnosis not present

## 2021-09-13 DIAGNOSIS — E785 Hyperlipidemia, unspecified: Secondary | ICD-10-CM | POA: Diagnosis not present

## 2021-09-13 DIAGNOSIS — I10 Essential (primary) hypertension: Secondary | ICD-10-CM | POA: Diagnosis not present

## 2021-09-13 DIAGNOSIS — D151 Benign neoplasm of heart: Secondary | ICD-10-CM | POA: Diagnosis not present

## 2021-09-13 DIAGNOSIS — I25738 Atherosclerosis of nonautologous biological coronary artery bypass graft(s) with other forms of angina pectoris: Secondary | ICD-10-CM | POA: Diagnosis not present

## 2021-09-13 DIAGNOSIS — R001 Bradycardia, unspecified: Secondary | ICD-10-CM | POA: Diagnosis not present

## 2021-09-13 DIAGNOSIS — F17200 Nicotine dependence, unspecified, uncomplicated: Secondary | ICD-10-CM | POA: Diagnosis not present

## 2021-09-13 DIAGNOSIS — I5022 Chronic systolic (congestive) heart failure: Secondary | ICD-10-CM | POA: Diagnosis not present

## 2021-10-01 DIAGNOSIS — Z79899 Other long term (current) drug therapy: Secondary | ICD-10-CM | POA: Diagnosis not present

## 2021-10-23 DIAGNOSIS — E1122 Type 2 diabetes mellitus with diabetic chronic kidney disease: Secondary | ICD-10-CM | POA: Diagnosis not present

## 2021-10-23 DIAGNOSIS — I10 Essential (primary) hypertension: Secondary | ICD-10-CM | POA: Diagnosis not present

## 2021-10-23 DIAGNOSIS — Z0001 Encounter for general adult medical examination with abnormal findings: Secondary | ICD-10-CM | POA: Diagnosis not present

## 2021-10-23 DIAGNOSIS — E063 Autoimmune thyroiditis: Secondary | ICD-10-CM | POA: Diagnosis not present

## 2021-10-23 DIAGNOSIS — I129 Hypertensive chronic kidney disease with stage 1 through stage 4 chronic kidney disease, or unspecified chronic kidney disease: Secondary | ICD-10-CM | POA: Diagnosis not present

## 2021-10-23 DIAGNOSIS — I4891 Unspecified atrial fibrillation: Secondary | ICD-10-CM | POA: Diagnosis not present

## 2021-10-23 DIAGNOSIS — I2510 Atherosclerotic heart disease of native coronary artery without angina pectoris: Secondary | ICD-10-CM | POA: Diagnosis not present

## 2021-10-23 DIAGNOSIS — G894 Chronic pain syndrome: Secondary | ICD-10-CM | POA: Diagnosis not present

## 2021-10-26 DIAGNOSIS — Z0001 Encounter for general adult medical examination with abnormal findings: Secondary | ICD-10-CM | POA: Diagnosis not present

## 2021-10-26 DIAGNOSIS — E063 Autoimmune thyroiditis: Secondary | ICD-10-CM | POA: Diagnosis not present

## 2021-10-26 DIAGNOSIS — E559 Vitamin D deficiency, unspecified: Secondary | ICD-10-CM | POA: Diagnosis not present

## 2021-10-26 DIAGNOSIS — E1122 Type 2 diabetes mellitus with diabetic chronic kidney disease: Secondary | ICD-10-CM | POA: Diagnosis not present

## 2021-11-27 DIAGNOSIS — Z79899 Other long term (current) drug therapy: Secondary | ICD-10-CM | POA: Diagnosis not present

## 2021-12-24 DIAGNOSIS — E063 Autoimmune thyroiditis: Secondary | ICD-10-CM | POA: Diagnosis not present

## 2021-12-24 DIAGNOSIS — E1122 Type 2 diabetes mellitus with diabetic chronic kidney disease: Secondary | ICD-10-CM | POA: Diagnosis not present

## 2021-12-24 DIAGNOSIS — M159 Polyosteoarthritis, unspecified: Secondary | ICD-10-CM | POA: Diagnosis not present

## 2021-12-24 DIAGNOSIS — G894 Chronic pain syndrome: Secondary | ICD-10-CM | POA: Diagnosis not present

## 2021-12-24 DIAGNOSIS — I10 Essential (primary) hypertension: Secondary | ICD-10-CM | POA: Diagnosis not present

## 2021-12-25 DIAGNOSIS — Z79899 Other long term (current) drug therapy: Secondary | ICD-10-CM | POA: Diagnosis not present

## 2022-02-22 DIAGNOSIS — G894 Chronic pain syndrome: Secondary | ICD-10-CM | POA: Diagnosis not present

## 2022-02-22 DIAGNOSIS — E063 Autoimmune thyroiditis: Secondary | ICD-10-CM | POA: Diagnosis not present

## 2022-02-22 DIAGNOSIS — I4891 Unspecified atrial fibrillation: Secondary | ICD-10-CM | POA: Diagnosis not present

## 2022-02-22 DIAGNOSIS — Z6824 Body mass index (BMI) 24.0-24.9, adult: Secondary | ICD-10-CM | POA: Diagnosis not present

## 2022-02-22 DIAGNOSIS — E1122 Type 2 diabetes mellitus with diabetic chronic kidney disease: Secondary | ICD-10-CM | POA: Diagnosis not present

## 2022-02-22 DIAGNOSIS — M159 Polyosteoarthritis, unspecified: Secondary | ICD-10-CM | POA: Diagnosis not present

## 2022-03-18 DIAGNOSIS — D151 Benign neoplasm of heart: Secondary | ICD-10-CM | POA: Diagnosis not present

## 2022-03-18 DIAGNOSIS — R001 Bradycardia, unspecified: Secondary | ICD-10-CM | POA: Diagnosis not present

## 2022-03-18 DIAGNOSIS — E785 Hyperlipidemia, unspecified: Secondary | ICD-10-CM | POA: Diagnosis not present

## 2022-03-18 DIAGNOSIS — I639 Cerebral infarction, unspecified: Secondary | ICD-10-CM | POA: Diagnosis not present

## 2022-03-18 DIAGNOSIS — Z951 Presence of aortocoronary bypass graft: Secondary | ICD-10-CM | POA: Diagnosis not present

## 2022-03-18 DIAGNOSIS — I10 Essential (primary) hypertension: Secondary | ICD-10-CM | POA: Diagnosis not present

## 2022-03-18 DIAGNOSIS — Z87891 Personal history of nicotine dependence: Secondary | ICD-10-CM | POA: Diagnosis not present

## 2022-03-18 DIAGNOSIS — I739 Peripheral vascular disease, unspecified: Secondary | ICD-10-CM | POA: Diagnosis not present

## 2022-03-18 DIAGNOSIS — I5022 Chronic systolic (congestive) heart failure: Secondary | ICD-10-CM | POA: Diagnosis not present

## 2022-03-18 DIAGNOSIS — I482 Chronic atrial fibrillation, unspecified: Secondary | ICD-10-CM | POA: Diagnosis not present

## 2022-03-18 DIAGNOSIS — I4891 Unspecified atrial fibrillation: Secondary | ICD-10-CM | POA: Diagnosis not present

## 2022-03-18 DIAGNOSIS — I25738 Atherosclerosis of nonautologous biological coronary artery bypass graft(s) with other forms of angina pectoris: Secondary | ICD-10-CM | POA: Diagnosis not present

## 2022-03-27 DIAGNOSIS — I4891 Unspecified atrial fibrillation: Secondary | ICD-10-CM | POA: Diagnosis not present

## 2022-04-11 DIAGNOSIS — Z79899 Other long term (current) drug therapy: Secondary | ICD-10-CM | POA: Diagnosis not present

## 2022-04-29 DIAGNOSIS — M159 Polyosteoarthritis, unspecified: Secondary | ICD-10-CM | POA: Diagnosis not present

## 2022-04-29 DIAGNOSIS — M1A00X0 Idiopathic chronic gout, unspecified site, without tophus (tophi): Secondary | ICD-10-CM | POA: Diagnosis not present

## 2022-04-29 DIAGNOSIS — I10 Essential (primary) hypertension: Secondary | ICD-10-CM | POA: Diagnosis not present

## 2022-04-29 DIAGNOSIS — I4891 Unspecified atrial fibrillation: Secondary | ICD-10-CM | POA: Diagnosis not present

## 2022-04-29 DIAGNOSIS — Z6824 Body mass index (BMI) 24.0-24.9, adult: Secondary | ICD-10-CM | POA: Diagnosis not present

## 2022-04-29 DIAGNOSIS — E063 Autoimmune thyroiditis: Secondary | ICD-10-CM | POA: Diagnosis not present

## 2022-04-29 DIAGNOSIS — G894 Chronic pain syndrome: Secondary | ICD-10-CM | POA: Diagnosis not present

## 2022-04-29 DIAGNOSIS — E1122 Type 2 diabetes mellitus with diabetic chronic kidney disease: Secondary | ICD-10-CM | POA: Diagnosis not present

## 2022-04-30 DIAGNOSIS — Z79899 Other long term (current) drug therapy: Secondary | ICD-10-CM | POA: Diagnosis not present

## 2022-05-30 DIAGNOSIS — Z79899 Other long term (current) drug therapy: Secondary | ICD-10-CM | POA: Diagnosis not present

## 2022-06-07 DIAGNOSIS — Z79899 Other long term (current) drug therapy: Secondary | ICD-10-CM | POA: Diagnosis not present

## 2022-06-25 DIAGNOSIS — I4891 Unspecified atrial fibrillation: Secondary | ICD-10-CM | POA: Diagnosis not present

## 2022-06-25 DIAGNOSIS — G894 Chronic pain syndrome: Secondary | ICD-10-CM | POA: Diagnosis not present

## 2022-06-25 DIAGNOSIS — E1122 Type 2 diabetes mellitus with diabetic chronic kidney disease: Secondary | ICD-10-CM | POA: Diagnosis not present

## 2022-06-25 DIAGNOSIS — I10 Essential (primary) hypertension: Secondary | ICD-10-CM | POA: Diagnosis not present

## 2022-06-25 DIAGNOSIS — M1991 Primary osteoarthritis, unspecified site: Secondary | ICD-10-CM | POA: Diagnosis not present

## 2022-06-25 DIAGNOSIS — E063 Autoimmune thyroiditis: Secondary | ICD-10-CM | POA: Diagnosis not present

## 2022-06-25 DIAGNOSIS — Z6824 Body mass index (BMI) 24.0-24.9, adult: Secondary | ICD-10-CM | POA: Diagnosis not present

## 2022-06-28 DIAGNOSIS — Z79899 Other long term (current) drug therapy: Secondary | ICD-10-CM | POA: Diagnosis not present

## 2022-07-08 DIAGNOSIS — Z79899 Other long term (current) drug therapy: Secondary | ICD-10-CM | POA: Diagnosis not present

## 2022-07-15 DIAGNOSIS — Z79899 Other long term (current) drug therapy: Secondary | ICD-10-CM | POA: Diagnosis not present

## 2022-09-17 DIAGNOSIS — I5022 Chronic systolic (congestive) heart failure: Secondary | ICD-10-CM | POA: Diagnosis not present

## 2022-09-17 DIAGNOSIS — I495 Sick sinus syndrome: Secondary | ICD-10-CM | POA: Diagnosis not present

## 2022-09-30 DIAGNOSIS — Z79899 Other long term (current) drug therapy: Secondary | ICD-10-CM | POA: Diagnosis not present

## 2022-10-25 DIAGNOSIS — Z0001 Encounter for general adult medical examination with abnormal findings: Secondary | ICD-10-CM | POA: Diagnosis not present

## 2022-10-25 DIAGNOSIS — I4891 Unspecified atrial fibrillation: Secondary | ICD-10-CM | POA: Diagnosis not present

## 2022-10-25 DIAGNOSIS — E1122 Type 2 diabetes mellitus with diabetic chronic kidney disease: Secondary | ICD-10-CM | POA: Diagnosis not present

## 2022-10-25 DIAGNOSIS — I129 Hypertensive chronic kidney disease with stage 1 through stage 4 chronic kidney disease, or unspecified chronic kidney disease: Secondary | ICD-10-CM | POA: Diagnosis not present

## 2022-10-25 DIAGNOSIS — Z125 Encounter for screening for malignant neoplasm of prostate: Secondary | ICD-10-CM | POA: Diagnosis not present

## 2022-10-25 DIAGNOSIS — I739 Peripheral vascular disease, unspecified: Secondary | ICD-10-CM | POA: Diagnosis not present

## 2022-10-25 DIAGNOSIS — M1991 Primary osteoarthritis, unspecified site: Secondary | ICD-10-CM | POA: Diagnosis not present

## 2022-10-25 DIAGNOSIS — N400 Enlarged prostate without lower urinary tract symptoms: Secondary | ICD-10-CM | POA: Diagnosis not present

## 2022-10-25 DIAGNOSIS — Z6824 Body mass index (BMI) 24.0-24.9, adult: Secondary | ICD-10-CM | POA: Diagnosis not present

## 2022-10-25 DIAGNOSIS — N182 Chronic kidney disease, stage 2 (mild): Secondary | ICD-10-CM | POA: Diagnosis not present

## 2022-10-25 DIAGNOSIS — Z1331 Encounter for screening for depression: Secondary | ICD-10-CM | POA: Diagnosis not present

## 2022-10-25 DIAGNOSIS — F1120 Opioid dependence, uncomplicated: Secondary | ICD-10-CM | POA: Diagnosis not present

## 2022-10-28 DIAGNOSIS — D518 Other vitamin B12 deficiency anemias: Secondary | ICD-10-CM | POA: Diagnosis not present

## 2022-10-28 DIAGNOSIS — Z125 Encounter for screening for malignant neoplasm of prostate: Secondary | ICD-10-CM | POA: Diagnosis not present

## 2022-10-28 DIAGNOSIS — E1122 Type 2 diabetes mellitus with diabetic chronic kidney disease: Secondary | ICD-10-CM | POA: Diagnosis not present

## 2022-10-28 DIAGNOSIS — I4891 Unspecified atrial fibrillation: Secondary | ICD-10-CM | POA: Diagnosis not present

## 2022-10-28 DIAGNOSIS — I10 Essential (primary) hypertension: Secondary | ICD-10-CM | POA: Diagnosis not present

## 2022-10-28 DIAGNOSIS — E538 Deficiency of other specified B group vitamins: Secondary | ICD-10-CM | POA: Diagnosis not present

## 2022-10-28 DIAGNOSIS — E559 Vitamin D deficiency, unspecified: Secondary | ICD-10-CM | POA: Diagnosis not present

## 2022-10-28 DIAGNOSIS — E063 Autoimmune thyroiditis: Secondary | ICD-10-CM | POA: Diagnosis not present

## 2022-10-28 DIAGNOSIS — Z0001 Encounter for general adult medical examination with abnormal findings: Secondary | ICD-10-CM | POA: Diagnosis not present

## 2022-11-04 DIAGNOSIS — Z79899 Other long term (current) drug therapy: Secondary | ICD-10-CM | POA: Diagnosis not present

## 2022-11-13 DIAGNOSIS — I495 Sick sinus syndrome: Secondary | ICD-10-CM | POA: Diagnosis not present

## 2022-11-27 DIAGNOSIS — Z79899 Other long term (current) drug therapy: Secondary | ICD-10-CM | POA: Diagnosis not present

## 2022-12-09 DIAGNOSIS — Z79899 Other long term (current) drug therapy: Secondary | ICD-10-CM | POA: Diagnosis not present

## 2022-12-25 DIAGNOSIS — E1122 Type 2 diabetes mellitus with diabetic chronic kidney disease: Secondary | ICD-10-CM | POA: Diagnosis not present

## 2022-12-25 DIAGNOSIS — E663 Overweight: Secondary | ICD-10-CM | POA: Diagnosis not present

## 2022-12-25 DIAGNOSIS — I4891 Unspecified atrial fibrillation: Secondary | ICD-10-CM | POA: Diagnosis not present

## 2022-12-25 DIAGNOSIS — G894 Chronic pain syndrome: Secondary | ICD-10-CM | POA: Diagnosis not present

## 2022-12-25 DIAGNOSIS — M159 Polyosteoarthritis, unspecified: Secondary | ICD-10-CM | POA: Diagnosis not present

## 2022-12-25 DIAGNOSIS — I10 Essential (primary) hypertension: Secondary | ICD-10-CM | POA: Diagnosis not present

## 2022-12-25 DIAGNOSIS — M1A00X0 Idiopathic chronic gout, unspecified site, without tophus (tophi): Secondary | ICD-10-CM | POA: Diagnosis not present

## 2022-12-25 DIAGNOSIS — Z6825 Body mass index (BMI) 25.0-25.9, adult: Secondary | ICD-10-CM | POA: Diagnosis not present

## 2022-12-25 DIAGNOSIS — I739 Peripheral vascular disease, unspecified: Secondary | ICD-10-CM | POA: Diagnosis not present

## 2022-12-25 DIAGNOSIS — E063 Autoimmune thyroiditis: Secondary | ICD-10-CM | POA: Diagnosis not present

## 2022-12-25 DIAGNOSIS — F1120 Opioid dependence, uncomplicated: Secondary | ICD-10-CM | POA: Diagnosis not present

## 2022-12-27 DIAGNOSIS — Z79899 Other long term (current) drug therapy: Secondary | ICD-10-CM | POA: Diagnosis not present

## 2022-12-30 DIAGNOSIS — E119 Type 2 diabetes mellitus without complications: Secondary | ICD-10-CM | POA: Diagnosis not present

## 2022-12-30 DIAGNOSIS — Z961 Presence of intraocular lens: Secondary | ICD-10-CM | POA: Diagnosis not present

## 2022-12-30 DIAGNOSIS — H43813 Vitreous degeneration, bilateral: Secondary | ICD-10-CM | POA: Diagnosis not present

## 2023-01-27 DIAGNOSIS — Z79899 Other long term (current) drug therapy: Secondary | ICD-10-CM | POA: Diagnosis not present

## 2023-02-20 DIAGNOSIS — E1122 Type 2 diabetes mellitus with diabetic chronic kidney disease: Secondary | ICD-10-CM | POA: Diagnosis not present

## 2023-02-20 DIAGNOSIS — I739 Peripheral vascular disease, unspecified: Secondary | ICD-10-CM | POA: Diagnosis not present

## 2023-02-20 DIAGNOSIS — M159 Polyosteoarthritis, unspecified: Secondary | ICD-10-CM | POA: Diagnosis not present

## 2023-02-20 DIAGNOSIS — Z6824 Body mass index (BMI) 24.0-24.9, adult: Secondary | ICD-10-CM | POA: Diagnosis not present

## 2023-02-20 DIAGNOSIS — I4891 Unspecified atrial fibrillation: Secondary | ICD-10-CM | POA: Diagnosis not present

## 2023-02-20 DIAGNOSIS — G894 Chronic pain syndrome: Secondary | ICD-10-CM | POA: Diagnosis not present

## 2023-02-20 DIAGNOSIS — F1120 Opioid dependence, uncomplicated: Secondary | ICD-10-CM | POA: Diagnosis not present

## 2023-02-20 DIAGNOSIS — I10 Essential (primary) hypertension: Secondary | ICD-10-CM | POA: Diagnosis not present

## 2023-02-27 ENCOUNTER — Ambulatory Visit: Payer: No Typology Code available for payment source | Admitting: Podiatry

## 2023-02-27 DIAGNOSIS — Z79899 Other long term (current) drug therapy: Secondary | ICD-10-CM | POA: Diagnosis not present

## 2023-02-27 DIAGNOSIS — M722 Plantar fascial fibromatosis: Secondary | ICD-10-CM | POA: Diagnosis not present

## 2023-02-27 NOTE — Progress Notes (Signed)
Subjective:  Patient ID: [name], male    DOB: Aug 02, 1941,  MRN: [id number]  Chief Complaint  Patient presents with   Foot Pain    Left heel pain    82 y.o. male presents with the above complaint.  Patient presents with left Planter fasciitis.  Patient states pain for touch is progressive and worse hurts with ambulation is with pressure he has not seen MRIs for the same he denies any other acute complaints.  Pain scale 7 out of 10 dull achy in nature   Review of Systems: Negative except as noted in the HPI. Denies N/V/F/Ch.  Past Medical History:  Diagnosis Date   Arthritis    Atrial fibrillation [facility])    Cardiac pacemaker in situ    Coronary artery disease due to lipid rich plaque    history of coronary artery bypass grafting at [facility] 2007   Dysrhythmia    Hyperlipidemia    Hypertension    Hypothyroidism    Presence of permanent cardiac pacemaker     Current Outpatient Medications:    allopurinol (ZYLOPRIM) 300 MG tablet, TAKE 1 TABLET(300 MG) BY MOUTH DAILY, Disp: 30 tablet, Rfl: 5   amLODipine (NORVASC) 10 MG tablet, Take 10 mg by mouth daily. , Disp: , Rfl:    fluorouracil (EFUDEX) 5 % cream, Apply 1 application topically 2 (two) times daily as needed for skin breakdown., Disp: , Rfl:    levothyroxine (SYNTHROID, LEVOTHROID) 25 MCG tablet, Take 25 mcg by mouth daily before breakfast., Disp: , Rfl:    lisinopril (PRINIVIL,ZESTRIL) 20 MG tablet, Take 20 mg by mouth daily. , Disp: , Rfl:    metoprolol succinate (TOPROL-XL) 25 MG 24 hr tablet, Take 25 mg by mouth daily. , Disp: , Rfl:    mupirocin cream (BACTROBAN) 2 %, Apply 1 application topically 2 (two) times daily., Disp: 15 g, Rfl: 0   omeprazole (PRILOSEC) 40 MG capsule, Take 40 mg by mouth daily as needed (for heartburn/indigestion). , Disp: , Rfl:    oxyCODONE (OXY IR/ROXICODONE) 5 MG immediate release tablet, Take 1 tablet (5 mg total) by mouth every 4 (four) hours as needed for  moderate pain (pain score 4-6)., Disp: 30 tablet, Rfl: 0   simvastatin (ZOCOR) 20 MG tablet, Take 20 mg by mouth every evening. , Disp: , Rfl:    sotalol (BETAPACE) 120 MG tablet, Take 120 mg by mouth 2 (two) times daily. , Disp: , Rfl:    tiZANidine (ZANAFLEX) 4 MG tablet, Take 1 tablet by mouth 3 (three) times daily., Disp: , Rfl:    traMADol (ULTRAM) 50 MG tablet, Take 1-2 tablets (50-100 mg total) by mouth every 4 (four) hours as needed for moderate pain., Disp: 60 tablet, Rfl: 0   warfarin (COUMADIN) 5 MG tablet, Take 5 mg by mouth daily. , Disp: , Rfl:   Social History   Tobacco Use  Smoking Status Never  Smokeless Tobacco Never    No Known Allergies Objective:  There were no vitals filed for this visit. There is no height or weight on file to calculate BMI. Constitutional Well developed. Well nourished.  Vascular Dorsalis pedis pulses palpable bilaterally. Posterior tibial pulses palpable bilaterally. Capillary refill normal to all digits.  No cyanosis or clubbing noted. Pedal hair growth normal.  Neurologic Normal speech. Oriented to person, place, and time. Epicritic sensation to light touch grossly present bilaterally.  Dermatologic Nails well groomed and normal in appearance. No open wounds. No skin lesions.  Orthopedic: Normal joint ROM without pain or crepitus bilaterally. No visible deformities. Tender to palpation at the calcaneal tuber left. No pain with calcaneal squeeze left. Ankle ROM diminished range of motion left. Silfverskiold Test: positive left.   Radiographs: None  Assessment:   1. Plantar fasciitis of left foot    Plan:  Patient was evaluated and treated and all questions answered.  Plantar Fasciitis, left - XR reviewed as above.  - Educated on icing and stretching. Instructions given.  - Injection delivered to the plantar fascia as below. - DME: Plantar fascial brace dispensed to support the medial longitudinal arch of the foot and  offload pressure from the heel and prevent arch collapse during weightbearing - Pharmacologic management: None  Procedure: Injection Tendon/Ligament Location: Left plantar fascia at the glabrous junction; medial approach. Skin Prep: alcohol Injectate: 0.5 cc 0.5% marcaine plain, 0.5 cc of 1% Lidocaine, 0.5 cc kenalog 10. Disposition: Patient tolerated procedure well. Injection site dressed with a band-aid.  No follow-ups on file.

## 2023-03-18 DIAGNOSIS — I495 Sick sinus syndrome: Secondary | ICD-10-CM | POA: Diagnosis not present

## 2023-03-31 DIAGNOSIS — Z79899 Other long term (current) drug therapy: Secondary | ICD-10-CM | POA: Diagnosis not present

## 2023-04-09 DIAGNOSIS — I2510 Atherosclerotic heart disease of native coronary artery without angina pectoris: Secondary | ICD-10-CM | POA: Diagnosis not present

## 2023-04-09 DIAGNOSIS — I4819 Other persistent atrial fibrillation: Secondary | ICD-10-CM | POA: Diagnosis not present

## 2023-04-09 DIAGNOSIS — R001 Bradycardia, unspecified: Secondary | ICD-10-CM | POA: Diagnosis not present

## 2023-04-28 DIAGNOSIS — G894 Chronic pain syndrome: Secondary | ICD-10-CM | POA: Diagnosis not present

## 2023-04-28 DIAGNOSIS — I4891 Unspecified atrial fibrillation: Secondary | ICD-10-CM | POA: Diagnosis not present

## 2023-04-28 DIAGNOSIS — E1122 Type 2 diabetes mellitus with diabetic chronic kidney disease: Secondary | ICD-10-CM | POA: Diagnosis not present

## 2023-04-28 DIAGNOSIS — Z6824 Body mass index (BMI) 24.0-24.9, adult: Secondary | ICD-10-CM | POA: Diagnosis not present

## 2023-04-28 DIAGNOSIS — I10 Essential (primary) hypertension: Secondary | ICD-10-CM | POA: Diagnosis not present

## 2023-04-28 DIAGNOSIS — F1120 Opioid dependence, uncomplicated: Secondary | ICD-10-CM | POA: Diagnosis not present

## 2023-04-28 DIAGNOSIS — Z23 Encounter for immunization: Secondary | ICD-10-CM | POA: Diagnosis not present

## 2023-04-28 DIAGNOSIS — M159 Polyosteoarthritis, unspecified: Secondary | ICD-10-CM | POA: Diagnosis not present

## 2023-05-20 DIAGNOSIS — I10 Essential (primary) hypertension: Secondary | ICD-10-CM | POA: Diagnosis not present

## 2023-05-20 DIAGNOSIS — I482 Chronic atrial fibrillation, unspecified: Secondary | ICD-10-CM | POA: Diagnosis not present

## 2023-05-20 DIAGNOSIS — R001 Bradycardia, unspecified: Secondary | ICD-10-CM | POA: Diagnosis not present

## 2023-05-20 DIAGNOSIS — I25738 Atherosclerosis of nonautologous biological coronary artery bypass graft(s) with other forms of angina pectoris: Secondary | ICD-10-CM | POA: Diagnosis not present

## 2023-05-20 DIAGNOSIS — Z951 Presence of aortocoronary bypass graft: Secondary | ICD-10-CM | POA: Diagnosis not present

## 2023-05-20 DIAGNOSIS — I4891 Unspecified atrial fibrillation: Secondary | ICD-10-CM | POA: Diagnosis not present

## 2023-05-20 DIAGNOSIS — Z87891 Personal history of nicotine dependence: Secondary | ICD-10-CM | POA: Diagnosis not present

## 2023-05-20 DIAGNOSIS — I739 Peripheral vascular disease, unspecified: Secondary | ICD-10-CM | POA: Diagnosis not present

## 2023-05-20 DIAGNOSIS — I5022 Chronic systolic (congestive) heart failure: Secondary | ICD-10-CM | POA: Diagnosis not present

## 2023-05-20 DIAGNOSIS — I639 Cerebral infarction, unspecified: Secondary | ICD-10-CM | POA: Diagnosis not present

## 2023-05-20 DIAGNOSIS — D151 Benign neoplasm of heart: Secondary | ICD-10-CM | POA: Diagnosis not present

## 2023-05-20 DIAGNOSIS — I495 Sick sinus syndrome: Secondary | ICD-10-CM | POA: Diagnosis not present

## 2023-05-28 DIAGNOSIS — Z79899 Other long term (current) drug therapy: Secondary | ICD-10-CM | POA: Diagnosis not present

## 2023-06-24 ENCOUNTER — Ambulatory Visit: Payer: No Typology Code available for payment source | Admitting: Podiatry

## 2023-06-27 DIAGNOSIS — I4891 Unspecified atrial fibrillation: Secondary | ICD-10-CM | POA: Diagnosis not present

## 2023-06-27 DIAGNOSIS — I10 Essential (primary) hypertension: Secondary | ICD-10-CM | POA: Diagnosis not present

## 2023-06-27 DIAGNOSIS — F1120 Opioid dependence, uncomplicated: Secondary | ICD-10-CM | POA: Diagnosis not present

## 2023-06-27 DIAGNOSIS — E1122 Type 2 diabetes mellitus with diabetic chronic kidney disease: Secondary | ICD-10-CM | POA: Diagnosis not present

## 2023-06-27 DIAGNOSIS — Z6824 Body mass index (BMI) 24.0-24.9, adult: Secondary | ICD-10-CM | POA: Diagnosis not present

## 2023-06-27 DIAGNOSIS — M159 Polyosteoarthritis, unspecified: Secondary | ICD-10-CM | POA: Diagnosis not present

## 2023-06-27 DIAGNOSIS — I739 Peripheral vascular disease, unspecified: Secondary | ICD-10-CM | POA: Diagnosis not present

## 2023-06-27 DIAGNOSIS — Z79899 Other long term (current) drug therapy: Secondary | ICD-10-CM | POA: Diagnosis not present

## 2023-06-27 DIAGNOSIS — M1A00X0 Idiopathic chronic gout, unspecified site, without tophus (tophi): Secondary | ICD-10-CM | POA: Diagnosis not present

## 2023-06-27 DIAGNOSIS — G894 Chronic pain syndrome: Secondary | ICD-10-CM | POA: Diagnosis not present

## 2023-07-08 DIAGNOSIS — Z79899 Other long term (current) drug therapy: Secondary | ICD-10-CM | POA: Diagnosis not present

## 2023-07-28 DIAGNOSIS — Z79899 Other long term (current) drug therapy: Secondary | ICD-10-CM | POA: Diagnosis not present

## 2023-08-04 DIAGNOSIS — E1122 Type 2 diabetes mellitus with diabetic chronic kidney disease: Secondary | ICD-10-CM | POA: Diagnosis not present

## 2023-08-04 DIAGNOSIS — Z79899 Other long term (current) drug therapy: Secondary | ICD-10-CM | POA: Diagnosis not present

## 2023-08-12 DIAGNOSIS — E782 Mixed hyperlipidemia: Secondary | ICD-10-CM | POA: Diagnosis not present

## 2023-08-12 DIAGNOSIS — I4891 Unspecified atrial fibrillation: Secondary | ICD-10-CM | POA: Diagnosis not present

## 2023-08-12 DIAGNOSIS — D6869 Other thrombophilia: Secondary | ICD-10-CM | POA: Diagnosis not present

## 2023-08-27 DIAGNOSIS — E063 Autoimmune thyroiditis: Secondary | ICD-10-CM | POA: Diagnosis not present

## 2023-08-27 DIAGNOSIS — M1A00X0 Idiopathic chronic gout, unspecified site, without tophus (tophi): Secondary | ICD-10-CM | POA: Diagnosis not present

## 2023-08-27 DIAGNOSIS — Z6824 Body mass index (BMI) 24.0-24.9, adult: Secondary | ICD-10-CM | POA: Diagnosis not present

## 2023-08-27 DIAGNOSIS — I4891 Unspecified atrial fibrillation: Secondary | ICD-10-CM | POA: Diagnosis not present

## 2023-08-27 DIAGNOSIS — G894 Chronic pain syndrome: Secondary | ICD-10-CM | POA: Diagnosis not present

## 2023-08-27 DIAGNOSIS — I10 Essential (primary) hypertension: Secondary | ICD-10-CM | POA: Diagnosis not present

## 2023-08-28 DIAGNOSIS — Z79899 Other long term (current) drug therapy: Secondary | ICD-10-CM | POA: Diagnosis not present

## 2023-09-26 DIAGNOSIS — Z79899 Other long term (current) drug therapy: Secondary | ICD-10-CM | POA: Diagnosis not present

## 2023-10-24 DIAGNOSIS — E782 Mixed hyperlipidemia: Secondary | ICD-10-CM | POA: Diagnosis not present

## 2023-10-24 DIAGNOSIS — E1122 Type 2 diabetes mellitus with diabetic chronic kidney disease: Secondary | ICD-10-CM | POA: Diagnosis not present

## 2023-10-24 DIAGNOSIS — E063 Autoimmune thyroiditis: Secondary | ICD-10-CM | POA: Diagnosis not present

## 2023-10-24 DIAGNOSIS — M159 Polyosteoarthritis, unspecified: Secondary | ICD-10-CM | POA: Diagnosis not present

## 2023-10-24 DIAGNOSIS — Z0001 Encounter for general adult medical examination with abnormal findings: Secondary | ICD-10-CM | POA: Diagnosis not present

## 2023-10-24 DIAGNOSIS — I10 Essential (primary) hypertension: Secondary | ICD-10-CM | POA: Diagnosis not present

## 2023-10-24 DIAGNOSIS — G894 Chronic pain syndrome: Secondary | ICD-10-CM | POA: Diagnosis not present

## 2023-10-24 DIAGNOSIS — Z79899 Other long term (current) drug therapy: Secondary | ICD-10-CM | POA: Diagnosis not present

## 2023-10-24 DIAGNOSIS — M1A00X0 Idiopathic chronic gout, unspecified site, without tophus (tophi): Secondary | ICD-10-CM | POA: Diagnosis not present

## 2023-10-24 DIAGNOSIS — Z9229 Personal history of other drug therapy: Secondary | ICD-10-CM | POA: Diagnosis not present

## 2023-11-25 DIAGNOSIS — Z79899 Other long term (current) drug therapy: Secondary | ICD-10-CM | POA: Diagnosis not present

## 2023-12-08 DIAGNOSIS — Z950 Presence of cardiac pacemaker: Secondary | ICD-10-CM | POA: Diagnosis not present

## 2023-12-08 DIAGNOSIS — I25738 Atherosclerosis of nonautologous biological coronary artery bypass graft(s) with other forms of angina pectoris: Secondary | ICD-10-CM | POA: Diagnosis not present

## 2023-12-08 DIAGNOSIS — E785 Hyperlipidemia, unspecified: Secondary | ICD-10-CM | POA: Diagnosis not present

## 2023-12-08 DIAGNOSIS — I10 Essential (primary) hypertension: Secondary | ICD-10-CM | POA: Diagnosis not present

## 2023-12-08 DIAGNOSIS — Z87891 Personal history of nicotine dependence: Secondary | ICD-10-CM | POA: Diagnosis not present

## 2023-12-08 DIAGNOSIS — I4891 Unspecified atrial fibrillation: Secondary | ICD-10-CM | POA: Diagnosis not present

## 2023-12-08 DIAGNOSIS — R001 Bradycardia, unspecified: Secondary | ICD-10-CM | POA: Diagnosis not present

## 2023-12-08 DIAGNOSIS — I639 Cerebral infarction, unspecified: Secondary | ICD-10-CM | POA: Diagnosis not present

## 2023-12-08 DIAGNOSIS — I495 Sick sinus syndrome: Secondary | ICD-10-CM | POA: Diagnosis not present

## 2023-12-08 DIAGNOSIS — I739 Peripheral vascular disease, unspecified: Secondary | ICD-10-CM | POA: Diagnosis not present

## 2023-12-08 DIAGNOSIS — Z951 Presence of aortocoronary bypass graft: Secondary | ICD-10-CM | POA: Diagnosis not present

## 2023-12-08 DIAGNOSIS — I5022 Chronic systolic (congestive) heart failure: Secondary | ICD-10-CM | POA: Diagnosis not present

## 2024-01-07 DIAGNOSIS — Z950 Presence of cardiac pacemaker: Secondary | ICD-10-CM | POA: Diagnosis not present

## 2024-01-07 DIAGNOSIS — I495 Sick sinus syndrome: Secondary | ICD-10-CM | POA: Diagnosis not present

## 2024-01-20 ENCOUNTER — Ambulatory Visit: Admitting: Podiatry

## 2024-01-20 DIAGNOSIS — I4891 Unspecified atrial fibrillation: Secondary | ICD-10-CM | POA: Diagnosis not present

## 2024-01-20 DIAGNOSIS — M62461 Contracture of muscle, right lower leg: Secondary | ICD-10-CM

## 2024-01-20 DIAGNOSIS — M25462 Effusion, left knee: Secondary | ICD-10-CM | POA: Diagnosis not present

## 2024-01-20 DIAGNOSIS — I10 Essential (primary) hypertension: Secondary | ICD-10-CM | POA: Diagnosis not present

## 2024-01-20 DIAGNOSIS — G894 Chronic pain syndrome: Secondary | ICD-10-CM | POA: Diagnosis not present

## 2024-01-20 DIAGNOSIS — M722 Plantar fascial fibromatosis: Secondary | ICD-10-CM

## 2024-01-20 DIAGNOSIS — M159 Polyosteoarthritis, unspecified: Secondary | ICD-10-CM | POA: Diagnosis not present

## 2024-01-20 DIAGNOSIS — E1122 Type 2 diabetes mellitus with diabetic chronic kidney disease: Secondary | ICD-10-CM | POA: Diagnosis not present

## 2024-01-20 NOTE — Progress Notes (Signed)
 Subjective:  Patient ID: [name], male    DOB: 01-May-1941,  MRN: [id number]  Chief Complaint  Patient presents with   Plantar Fasciitis    83 y.o. male presents with the above complaint.  Patient present with a new complaint of right plantar fasciitis for 2 laminations with pressure patient wanted to discuss treatment options for has not seen and was prior to see me denies any other acute complaints  Review of Systems: Negative except as noted in the HPI. Denies N/V/F/Ch.  Past Medical History:  Diagnosis Date   Arthritis    Atrial fibrillation [facility])    Cardiac pacemaker in situ    Coronary artery disease due to lipid rich plaque    history of coronary artery bypass grafting at [facility] 2007   Dysrhythmia    Hyperlipidemia    Hypertension    Hypothyroidism    Presence of permanent cardiac pacemaker     Current Outpatient Medications:    allopurinol  (ZYLOPRIM ) 300 MG tablet, TAKE 1 TABLET(300 MG) BY MOUTH DAILY, Disp: 30 tablet, Rfl: 5   amLODipine  (NORVASC ) 10 MG tablet, Take 10 mg by mouth daily. , Disp: , Rfl:    fluorouracil  (EFUDEX ) 5 % cream, Apply 1 application topically 2 (two) times daily as needed for skin breakdown., Disp: , Rfl:    levothyroxine  (SYNTHROID , LEVOTHROID) 25 MCG tablet, Take 25 mcg by mouth daily before breakfast., Disp: , Rfl:    lisinopril  (PRINIVIL ,ZESTRIL ) 20 MG tablet, Take 20 mg by mouth daily. , Disp: , Rfl:    metoprolol  succinate (TOPROL -XL) 25 MG 24 hr tablet, Take 25 mg by mouth daily. , Disp: , Rfl:    mupirocin  cream (BACTROBAN ) 2 %, Apply 1 application topically 2 (two) times daily., Disp: 15 g, Rfl: 0   omeprazole (PRILOSEC) 40 MG capsule, Take 40 mg by mouth daily as needed (for heartburn/indigestion). , Disp: , Rfl:    oxyCODONE  (OXY IR/ROXICODONE ) 5 MG immediate release tablet, Take 1 tablet (5 mg total) by mouth every 4 (four) hours as needed for moderate pain (pain score 4-6)., Disp: 30 tablet, Rfl: 0    simvastatin  (ZOCOR ) 20 MG tablet, Take 20 mg by mouth every evening. , Disp: , Rfl:    sotalol  (BETAPACE ) 120 MG tablet, Take 120 mg by mouth 2 (two) times daily. , Disp: , Rfl:    tiZANidine  (ZANAFLEX ) 4 MG tablet, Take 1 tablet by mouth 3 (three) times daily., Disp: , Rfl:    traMADol  (ULTRAM ) 50 MG tablet, Take 1-2 tablets (50-100 mg total) by mouth every 4 (four) hours as needed for moderate pain., Disp: 60 tablet, Rfl: 0   warfarin (COUMADIN ) 5 MG tablet, Take 5 mg by mouth daily. , Disp: , Rfl:   Social History   Tobacco Use  Smoking Status Never  Smokeless Tobacco Never    No Known Allergies Objective:  There were no vitals filed for this visit. There is no height or weight on file to calculate BMI. Constitutional Well developed. Well nourished.  Vascular Dorsalis pedis pulses palpable bilaterally. Posterior tibial pulses palpable bilaterally. Capillary refill normal to all digits.  No cyanosis or clubbing noted. Pedal hair growth normal.  Neurologic Normal speech. Oriented to person, place, and time. Epicritic sensation to light touch grossly present bilaterally.  Dermatologic Nails well groomed and normal in appearance. No open wounds. No skin lesions.  Orthopedic: Normal joint ROM without pain or crepitus bilaterally. No visible deformities. Tender to palpation at the calcaneal  tuber right foot. No pain with calcaneal squeeze right foot Ankle ROM diminished range of motion right foot Silfverskiold Test: positive right foot   Radiographs: None  Assessment:   1. Plantar fasciitis of right foot   2. Gastrocnemius equinus of right lower extremity     Plan:  Patient was evaluated and treated and all questions answered.  Plantar Fasciitis, right foot with underlying Achilles equinus - XR reviewed as above.  - Educated on icing and stretching. Instructions given.  - Injection delivered to the plantar fascia as below. - DME: Plantar fascial brace dispensed to  support the medial longitudinal arch of the foot and offload pressure from the heel and prevent arch collapse during weightbearing - Pharmacologic management: None  Procedure: Injection Tendon/Ligament Location: Right plantar fascia at the glabrous junction; medial approach. Skin Prep: alcohol Injectate: 0.5 cc 0.5% marcaine  plain, 0.5 cc of 1% Lidocaine , 0.5 cc kenalog  10. Disposition: Patient tolerated procedure well. Injection site dressed with a band-aid.  No follow-ups on file.

## 2024-01-23 DIAGNOSIS — Z79899 Other long term (current) drug therapy: Secondary | ICD-10-CM | POA: Diagnosis not present

## 2024-02-17 ENCOUNTER — Ambulatory Visit: Admitting: Podiatry

## 2024-02-17 ENCOUNTER — Encounter: Payer: Self-pay | Admitting: Podiatry

## 2024-02-17 VITALS — Ht 66.0 in | Wt 151.0 lb

## 2024-02-17 DIAGNOSIS — M722 Plantar fascial fibromatosis: Secondary | ICD-10-CM | POA: Diagnosis not present

## 2024-02-17 NOTE — Progress Notes (Signed)
 Subjective:  Patient ID: [name], male    DOB: 1940/09/08,  MRN: [id number]  No chief complaint on file.   83 y.o. male presents with the above complaint.  Patient presents with complaint right plantar fasciitis is doing a lot better the injection helped considerably.  He does not have any further pain is 100% improved  Review of Systems: Negative except as noted in the HPI. Denies N/V/F/Ch.  Past Medical History:  Diagnosis Date   Arthritis    Atrial fibrillation [facility])    Cardiac pacemaker in situ    Coronary artery disease due to lipid rich plaque    history of coronary artery bypass grafting at [facility] 2007   Dysrhythmia    Hyperlipidemia    Hypertension    Hypothyroidism    Presence of permanent cardiac pacemaker     Current Outpatient Medications:    allopurinol  (ZYLOPRIM ) 300 MG tablet, TAKE 1 TABLET(300 MG) BY MOUTH DAILY, Disp: 30 tablet, Rfl: 5   amLODipine  (NORVASC ) 10 MG tablet, Take 10 mg by mouth daily. , Disp: , Rfl:    fluorouracil  (EFUDEX ) 5 % cream, Apply 1 application topically 2 (two) times daily as needed for skin breakdown., Disp: , Rfl:    levothyroxine  (SYNTHROID , LEVOTHROID) 25 MCG tablet, Take 25 mcg by mouth daily before breakfast., Disp: , Rfl:    lisinopril  (PRINIVIL ,ZESTRIL ) 20 MG tablet, Take 20 mg by mouth daily. , Disp: , Rfl:    metoprolol  succinate (TOPROL -XL) 25 MG 24 hr tablet, Take 25 mg by mouth daily. , Disp: , Rfl:    mupirocin  cream (BACTROBAN ) 2 %, Apply 1 application topically 2 (two) times daily., Disp: 15 g, Rfl: 0   omeprazole (PRILOSEC) 40 MG capsule, Take 40 mg by mouth daily as needed (for heartburn/indigestion). , Disp: , Rfl:    oxyCODONE  (OXY IR/ROXICODONE ) 5 MG immediate release tablet, Take 1 tablet (5 mg total) by mouth every 4 (four) hours as needed for moderate pain (pain score 4-6)., Disp: 30 tablet, Rfl: 0   simvastatin  (ZOCOR ) 20 MG tablet, Take 20 mg by mouth every evening. , Disp: , Rfl:     sotalol  (BETAPACE ) 120 MG tablet, Take 120 mg by mouth 2 (two) times daily. , Disp: , Rfl:    tiZANidine  (ZANAFLEX ) 4 MG tablet, Take 1 tablet by mouth 3 (three) times daily., Disp: , Rfl:    traMADol  (ULTRAM ) 50 MG tablet, Take 1-2 tablets (50-100 mg total) by mouth every 4 (four) hours as needed for moderate pain., Disp: 60 tablet, Rfl: 0   warfarin (COUMADIN ) 5 MG tablet, Take 5 mg by mouth daily. , Disp: , Rfl:   Social History   Tobacco Use  Smoking Status Never  Smokeless Tobacco Never    No Known Allergies Objective:  There were no vitals filed for this visit. Body mass index is 24.37 kg/m. Constitutional Well developed. Well nourished.  Vascular Dorsalis pedis pulses palpable bilaterally. Posterior tibial pulses palpable bilaterally. Capillary refill normal to all digits.  No cyanosis or clubbing noted. Pedal hair growth normal.  Neurologic Normal speech. Oriented to person, place, and time. Epicritic sensation to light touch grossly present bilaterally.  Dermatologic Nails well groomed and normal in appearance. No open wounds. No skin lesions.  Orthopedic: Normal joint ROM without pain or crepitus bilaterally. No visible deformities. Tender to palpation at the calcaneal tuber right foot. No pain with calcaneal squeeze right foot Ankle ROM diminished range of motion right foot Silfverskiold Test:  positive right foot   Radiographs: None  Assessment:   No diagnosis found.   Plan:  Patient was evaluated and treated and all questions answered.  Plantar Fasciitis, right foot with underlying Achilles equinus - Clinically healed no further signs of plantar fasciitis noted.  Will hold off on another injection.  Discussed prevention technique discussed shoe gear modification if any foot and ankle issues arise in the future he will come back and see me.  No follow-ups on file.

## 2024-02-25 DIAGNOSIS — Z79899 Other long term (current) drug therapy: Secondary | ICD-10-CM | POA: Diagnosis not present

## 2024-03-19 DIAGNOSIS — I4891 Unspecified atrial fibrillation: Secondary | ICD-10-CM | POA: Diagnosis not present

## 2024-03-19 DIAGNOSIS — Z6824 Body mass index (BMI) 24.0-24.9, adult: Secondary | ICD-10-CM | POA: Diagnosis not present

## 2024-03-19 DIAGNOSIS — G894 Chronic pain syndrome: Secondary | ICD-10-CM | POA: Diagnosis not present

## 2024-03-19 DIAGNOSIS — M159 Polyosteoarthritis, unspecified: Secondary | ICD-10-CM | POA: Diagnosis not present

## 2024-03-24 DIAGNOSIS — Z79899 Other long term (current) drug therapy: Secondary | ICD-10-CM | POA: Diagnosis not present

## 2024-05-18 ENCOUNTER — Ambulatory Visit: Admitting: Podiatry

## 2024-05-18 DIAGNOSIS — M62461 Contracture of muscle, right lower leg: Secondary | ICD-10-CM | POA: Diagnosis not present

## 2024-05-18 DIAGNOSIS — M722 Plantar fascial fibromatosis: Secondary | ICD-10-CM | POA: Diagnosis not present

## 2024-05-18 NOTE — Progress Notes (Signed)
 Subjective:  Patient ID: [name], male    DOB: 1941/03/25,  MRN: [id number]  Chief Complaint  Patient presents with   Heel Spurs    R heel pain is not getting any better. He states he need and injection in that heel    83 y.o. male presents with the above complaint.  Patient presents with follow-up for plantar fasciitis he states started coming back again.  Just wanted to get it evaluated.  He was doing good overall.  Review of Systems: Negative except as noted in the HPI. Denies N/V/F/Ch.  Past Medical History:  Diagnosis Date   Arthritis    Atrial fibrillation [facility])    Cardiac pacemaker in situ    Coronary artery disease due to lipid rich plaque    history of coronary artery bypass grafting at [facility] 2007   Dysrhythmia    Hyperlipidemia    Hypertension    Hypothyroidism    Presence of permanent cardiac pacemaker     Current Outpatient Medications:    allopurinol  (ZYLOPRIM ) 300 MG tablet, TAKE 1 TABLET(300 MG) BY MOUTH DAILY, Disp: 30 tablet, Rfl: 5   amLODipine  (NORVASC ) 10 MG tablet, Take 10 mg by mouth daily. , Disp: , Rfl:    fluorouracil  (EFUDEX ) 5 % cream, Apply 1 application topically 2 (two) times daily as needed for skin breakdown., Disp: , Rfl:    levothyroxine  (SYNTHROID , LEVOTHROID) 25 MCG tablet, Take 25 mcg by mouth daily before breakfast., Disp: , Rfl:    lisinopril  (PRINIVIL ,ZESTRIL ) 20 MG tablet, Take 20 mg by mouth daily. , Disp: , Rfl:    metoprolol  succinate (TOPROL -XL) 25 MG 24 hr tablet, Take 25 mg by mouth daily. , Disp: , Rfl:    mupirocin  cream (BACTROBAN ) 2 %, Apply 1 application topically 2 (two) times daily., Disp: 15 g, Rfl: 0   omeprazole (PRILOSEC) 40 MG capsule, Take 40 mg by mouth daily as needed (for heartburn/indigestion). , Disp: , Rfl:    oxyCODONE  (OXY IR/ROXICODONE ) 5 MG immediate release tablet, Take 1 tablet (5 mg total) by mouth every 4 (four) hours as needed for moderate pain (pain score 4-6)., Disp: 30  tablet, Rfl: 0   simvastatin  (ZOCOR ) 20 MG tablet, Take 20 mg by mouth every evening. , Disp: , Rfl:    sotalol  (BETAPACE ) 120 MG tablet, Take 120 mg by mouth 2 (two) times daily. , Disp: , Rfl:    tiZANidine  (ZANAFLEX ) 4 MG tablet, Take 1 tablet by mouth 3 (three) times daily., Disp: , Rfl:    traMADol  (ULTRAM ) 50 MG tablet, Take 1-2 tablets (50-100 mg total) by mouth every 4 (four) hours as needed for moderate pain., Disp: 60 tablet, Rfl: 0   warfarin (COUMADIN ) 5 MG tablet, Take 5 mg by mouth daily. , Disp: , Rfl:   Social History   Tobacco Use  Smoking Status Never  Smokeless Tobacco Never    No Known Allergies Objective:  There were no vitals filed for this visit. There is no height or weight on file to calculate BMI. Constitutional Well developed. Well nourished.  Vascular Dorsalis pedis pulses palpable bilaterally. Posterior tibial pulses palpable bilaterally. Capillary refill normal to all digits.  No cyanosis or clubbing noted. Pedal hair growth normal.  Neurologic Normal speech. Oriented to person, place, and time. Epicritic sensation to light touch grossly present bilaterally.  Dermatologic Nails well groomed and normal in appearance. No open wounds. No skin lesions.  Orthopedic: Normal joint ROM without pain or crepitus  bilaterally. No visible deformities. Tender to palpation at the calcaneal tuber right foot. No pain with calcaneal squeeze right foot Ankle ROM diminished range of motion right foot Silfverskiold Test: positive right foot   Radiographs: None  Assessment:   1. Gastrocnemius equinus of right lower extremity   2. Plantar fasciitis of right foot      Plan:  Patient was evaluated and treated and all questions answered.  Plantar Fasciitis, right foot with underlying gastrocnemius equinus - XR reviewed as above.  - Educated on icing and stretching. Instructions given.  - Injection delivered to the plantar fascia as below. - DME: Plantar  fascial brace dispensed to support the medial longitudinal arch of the foot and offload pressure from the heel and prevent arch collapse during weightbearing - Pharmacologic management: None  Procedure: Injection Tendon/Ligament Location: Right plantar fascia at the glabrous junction; medial approach. Skin Prep: alcohol Injectate: 0.5 cc 0.5% marcaine  plain, 0.5 cc of 1% Lidocaine , 0.5 cc kenalog  10. Disposition: Patient tolerated procedure well. Injection site dressed with a band-aid.  No follow-ups on file.

## 2024-07-06 ENCOUNTER — Ambulatory Visit: Admitting: Podiatry

## 2024-07-06 DIAGNOSIS — M62461 Contracture of muscle, right lower leg: Secondary | ICD-10-CM | POA: Diagnosis not present

## 2024-07-06 DIAGNOSIS — M722 Plantar fascial fibromatosis: Secondary | ICD-10-CM

## 2024-07-06 NOTE — Progress Notes (Signed)
 Subjective:  Patient ID: [name], male    DOB: [birth year],  MRN: [id number]  Chief Complaint  Patient presents with   Foot Pain    83 y.o. male presents with the above complaint.  Patient presents with follow-up for plantar fasciitis he states started coming back again.  Just wanted to get it evaluated.  He was doing good overall.  Review of Systems: Negative except as noted in the HPI. Denies N/V/F/Ch.  Past Medical History:  Diagnosis Date   Arthritis    Atrial fibrillation [facility])    Cardiac pacemaker in situ    Coronary artery disease due to lipid rich plaque    history of coronary artery bypass grafting at [facility] 2007   Dysrhythmia    Hyperlipidemia    Hypertension    Hypothyroidism    Presence of permanent cardiac pacemaker     Current Outpatient Medications:    allopurinol  (ZYLOPRIM ) 300 MG tablet, TAKE 1 TABLET(300 MG) BY MOUTH DAILY, Disp: 30 tablet, Rfl: 5   amLODipine  (NORVASC ) 10 MG tablet, Take 10 mg by mouth daily. , Disp: , Rfl:    fluorouracil  (EFUDEX ) 5 % cream, Apply 1 application topically 2 (two) times daily as needed for skin breakdown., Disp: , Rfl:    levothyroxine  (SYNTHROID , LEVOTHROID) 25 MCG tablet, Take 25 mcg by mouth daily before breakfast., Disp: , Rfl:    lisinopril  (PRINIVIL ,ZESTRIL ) 20 MG tablet, Take 20 mg by mouth daily. , Disp: , Rfl:    metoprolol  succinate (TOPROL -XL) 25 MG 24 hr tablet, Take 25 mg by mouth daily. , Disp: , Rfl:    mupirocin  cream (BACTROBAN ) 2 %, Apply 1 application topically 2 (two) times daily., Disp: 15 g, Rfl: 0   omeprazole (PRILOSEC) 40 MG capsule, Take 40 mg by mouth daily as needed (for heartburn/indigestion). , Disp: , Rfl:    oxyCODONE  (OXY IR/ROXICODONE ) 5 MG immediate release tablet, Take 1 tablet (5 mg total) by mouth every 4 (four) hours as needed for moderate pain (pain score 4-6)., Disp: 30 tablet, Rfl: 0   simvastatin  (ZOCOR ) 20 MG tablet, Take 20 mg by mouth every evening. ,  Disp: , Rfl:    sotalol  (BETAPACE ) 120 MG tablet, Take 120 mg by mouth 2 (two) times daily. , Disp: , Rfl:    tiZANidine  (ZANAFLEX ) 4 MG tablet, Take 1 tablet by mouth 3 (three) times daily., Disp: , Rfl:    traMADol  (ULTRAM ) 50 MG tablet, Take 1-2 tablets (50-100 mg total) by mouth every 4 (four) hours as needed for moderate pain., Disp: 60 tablet, Rfl: 0   warfarin (COUMADIN ) 5 MG tablet, Take 5 mg by mouth daily. , Disp: , Rfl:   Social History   Tobacco Use  Smoking Status Never  Smokeless Tobacco Never    No Known Allergies Objective:  There were no vitals filed for this visit. There is no height or weight on file to calculate BMI. Constitutional Well developed. Well nourished.  Vascular Dorsalis pedis pulses palpable bilaterally. Posterior tibial pulses palpable bilaterally. Capillary refill normal to all digits.  No cyanosis or clubbing noted. Pedal hair growth normal.  Neurologic Normal speech. Oriented to person, place, and time. Epicritic sensation to light touch grossly present bilaterally.  Dermatologic Nails well groomed and normal in appearance. No open wounds. No skin lesions.  Orthopedic: Normal joint ROM without pain or crepitus bilaterally. No visible deformities. Tender to palpation at the calcaneal tuber right foot. No pain with calcaneal squeeze right foot  Ankle ROM diminished range of motion right foot Silf[place]old Test: positive right foot   Radiographs: None  Assessment:   1. Gastrocnemius equinus of right lower extremity   2. Plantar fasciitis of right foot       Plan:  Patient was evaluated and treated and all questions answered.  Plantar Fasciitis, right foot with underlying gastrocnemius equinus - XR reviewed as above.  - Educated on icing and stretching. Instructions given.  - Injection delivered to the plantar fascia as below. - DME: Plantar fascial brace dispensed to support the medial longitudinal arch of the foot and offload  pressure from the heel and prevent arch collapse during weightbearing - Pharmacologic management: None  Procedure: Injection Tendon/Ligament Location: Right plantar fascia at the glabrous junction; medial approach. Skin Prep: alcohol Injectate: 0.5 cc 0.5% marcaine  plain, 0.5 cc of 1% Lidocaine , 0.5 cc kenalog  10. Disposition: Patient tolerated procedure well. Injection site dressed with a band-aid.  No follow-ups on file.

## 2024-07-27 MED ORDER — SODIUM CHLORIDE 0.9 % IV SOLN: INTRAVENOUS | Status: DC

## 2024-07-28 ENCOUNTER — Other Ambulatory Visit: Payer: Self-pay

## 2024-07-28 ENCOUNTER — Ambulatory Visit: Admitting: Anesthesiology

## 2024-07-28 ENCOUNTER — Encounter: Admission: RE | Disposition: A | Payer: Self-pay | Attending: Internal Medicine

## 2024-07-28 ENCOUNTER — Encounter: Payer: Self-pay | Admitting: Internal Medicine

## 2024-07-28 ENCOUNTER — Ambulatory Visit
Admission: RE | Admit: 2024-07-28 | Discharge: 2024-07-28 | Disposition: A | Discharge status: Home / Self Care | Attending: Internal Medicine | Admitting: Internal Medicine

## 2024-07-28 DIAGNOSIS — I4892 Unspecified atrial flutter: Secondary | ICD-10-CM | POA: Diagnosis not present

## 2024-07-28 DIAGNOSIS — I10 Essential (primary) hypertension: Secondary | ICD-10-CM | POA: Insufficient documentation

## 2024-07-28 DIAGNOSIS — I2510 Atherosclerotic heart disease of native coronary artery without angina pectoris: Secondary | ICD-10-CM | POA: Diagnosis not present

## 2024-07-28 DIAGNOSIS — Z950 Presence of cardiac pacemaker: Secondary | ICD-10-CM | POA: Insufficient documentation

## 2024-07-28 DIAGNOSIS — I4891 Unspecified atrial fibrillation: Secondary | ICD-10-CM | POA: Insufficient documentation

## 2024-07-28 DIAGNOSIS — E039 Hypothyroidism, unspecified: Secondary | ICD-10-CM | POA: Insufficient documentation

## 2024-07-28 DIAGNOSIS — I4819 Other persistent atrial fibrillation: Secondary | ICD-10-CM

## 2024-07-28 HISTORY — PX: CARDIOVERSION: SHX1299

## 2024-07-28 LAB — PROTIME-INR
INR: 2.7 — ABNORMAL HIGH (ref 0.8–1.2)
Prothrombin Time: 29.6 s — ABNORMAL HIGH (ref 11.4–15.2)

## 2024-07-28 SURGERY — CARDIOVERSION
Anesthesia: General

## 2024-07-28 MED ORDER — PROPOFOL 10 MG/ML IV BOLUS
INTRAVENOUS | Status: DC | PRN
  Administered 2024-07-28: 13:00:00 40 mg via INTRAVENOUS

## 2024-07-28 NOTE — Anesthesia Procedure Notes (Addendum)
 Procedure Name: MAC Date/Time: 07/28/2024 12:41 PM  Performed by: Lacretia Camelia NOVAK, CRNAPre-anesthesia Checklist: Patient identified, Emergency Drugs available, Suction available and Patient being monitored Patient Re-evaluated:Patient Re-evaluated prior to induction Oxygen Delivery Method: Nasal cannula

## 2024-07-28 NOTE — Transfer of Care (Signed)
 Immediate Anesthesia Transfer of Care Note  Patient: [name]  Procedure(s) Performed: CARDIOVERSION  Patient Location: PACU and special recoveries  Anesthesia Type:General  Level of Consciousness: drowsy and patient cooperative  Airway & Oxygen Therapy: Patient Spontanous Breathing and Patient connected to nasal cannula oxygen  Post-op Assessment: Report given to RN and Post -op Vital signs reviewed and stable  Post vital signs: Reviewed and stable  Last Vitals:  Vitals Value Taken Time  BP 100/53 07/28/24 12:59  Temp    Pulse 60 07/28/24 13:00  Resp 32 07/28/24 13:00  SpO2 92 % 07/28/24 13:00    Last Pain:  Vitals:   07/28/24 1200  TempSrc: Temporal  PainSc: 0-No pain         Complications: No notable events documented.

## 2024-07-28 NOTE — CV Procedure (Signed)
 Electrical Cardioversion Procedure Note   Procedure: Electrical Cardioversion Indications:  Atrial Flutter  Procedure Details Consent: Risks of procedure as well as the alternatives and risks of each were explained to the (patient/caregiver).  Consent for procedure obtained. Time Out: Verified patient identification, verified procedure, site/side was marked, verified correct patient position, special equipment/implants available, medications/allergies/relevent history reviewed, required imaging and test results available.  Performed  Patient placed on cardiac monitor, pulse oximetry, supplemental oxygen as necessary.  Sedation given: Propofol  as per anesthesia Pacer pads placed anterior and posterior chest.  Cardioverted 1 time(s).  Cardioverted at 200J.  Evaluation Findings: Post procedure EKG shows: NSR Complications: None Patient did tolerate procedure well.  Cara Lovelace MD 07/28/24

## 2024-07-28 NOTE — Anesthesia Preprocedure Evaluation (Signed)
 Anesthesia Evaluation  Patient identified by MRN, date of birth, ID band Patient awake    Reviewed: Allergy & Precautions, NPO status , Patient's Chart, lab work & pertinent test results  History of Anesthesia Complications Negative for: history of anesthetic complications  Airway Mallampati: III  TM Distance: <3 FB Neck ROM: full    Dental  (+) Chipped, Poor Dentition   Pulmonary neg pulmonary ROS, neg shortness of breath   Pulmonary exam normal        Cardiovascular Exercise Tolerance: Good hypertension, + CAD  + dysrhythmias Atrial Fibrillation + pacemaker  Rhythm:irregular Rate:Tachycardia     Neuro/Psych CVA  negative psych ROS   GI/Hepatic negative GI ROS, Neg liver ROS,neg GERD  ,,  Endo/Other  Hypothyroidism    Renal/GU negative Renal ROS  negative genitourinary   Musculoskeletal   Abdominal   Peds  Hematology negative hematology ROS (+)   Anesthesia Other Findings Past Medical History: No date: Arthritis No date: Atrial fibrillation (HCC) No date: Cardiac pacemaker in situ No date: Coronary artery disease due to lipid rich plaque     Comment:  history of coronary artery bypass grafting at [facility] 2007 No date: Dysrhythmia No date: Hyperlipidemia No date: Hypertension No date: Hypothyroidism No date: Presence of permanent cardiac pacemaker  Past Surgical History: No date: BACK SURGERY No date: CATARACT EXTRACTION W/ INTRAOCULAR LENS IMPLANT; Bilateral No date: CORONARY ARTERY BYPASS GRAFT No date: FRACTURE SURGERY; Left No date: HERNIA REPAIR No date: HIP FRACTURE SURGERY No date: INSERT / REPLACE / REMOVE PACEMAKER 12/22/2017: KNEE ARTHROPLASTY; Left     Comment:  Procedure: COMPUTER ASSISTED TOTAL KNEE ARTHROPLASTY;                Surgeon: Mardee Lynwood SQUIBB, MD;  Location: [facility] ORS;                Service: Orthopedics;  Laterality: Left; 02/06/2018: KNEE  CLOSED REDUCTION; Left     Comment:  Procedure: CLOSED MANIPULATION KNEE;  Surgeon: Mardee Lynwood SQUIBB, MD;  Location: [facility] ORS;  Service: Orthopedics;               Laterality: Left; No date: open heart surgery 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 No date: VENTRICULAR CARDIAC PACEMAKER INSERTION     Reproductive/Obstetrics negative OB ROS                              Anesthesia Physical Anesthesia Plan  ASA: 4  Anesthesia Plan: General   Post-op Pain Management:    Induction: Intravenous  PONV Risk Score and Plan: Propofol  infusion and TIVA  Airway Management Planned: Natural Airway and Nasal Cannula  Additional Equipment:   Intra-op Plan:   Post-operative Plan:   Informed Consent: I have reviewed the patients History and Physical, chart, labs and discussed the procedure including the risks, benefits and alternatives for the proposed anesthesia with the patient or authorized representative who has indicated his/her understanding and acceptance.     Dental Advisory Given  Plan Discussed with: Anesthesiologist, CRNA and Surgeon  Anesthesia Plan Comments: (Patient consented for risks of anesthesia including but not limited to:  - adverse reactions to medications - risk of airway placement if required - damage to eyes, teeth, lips or other oral mucosa -  nerve damage due to positioning  - sore throat or hoarseness - Damage to heart, brain, nerves, lungs, other parts of body or loss of life  Patient voiced understanding and assent.)        Anesthesia Quick Evaluation

## 2024-07-28 NOTE — Anesthesia Postprocedure Evaluation (Signed)
 Anesthesia Post Note  Patient: [name]  Procedure(s) Performed: CARDIOVERSION  Patient location during evaluation: Endoscopy Anesthesia Type: General Level of consciousness: awake and alert Pain management: pain level controlled Vital Signs Assessment: post-procedure vital signs reviewed and stable Respiratory status: spontaneous breathing, nonlabored ventilation and respiratory function stable Cardiovascular status: blood pressure returned to baseline and stable Postop Assessment: no apparent nausea or vomiting Anesthetic complications: no   No notable events documented.   Last Vitals:  Vitals:   07/28/24 1330 07/28/24 1340  BP: (!) 107/56 (!) 93/51  Pulse: 71 71  Resp: 13 15  Temp:  36.7 C  SpO2: 98% 97%    Last Pain:  Vitals:   07/28/24 1340  TempSrc: Temporal  PainSc: 0-No pain                 Fairy POUR Tymeer Vaquera

## 2024-07-29 ENCOUNTER — Encounter: Payer: Self-pay | Admitting: Internal Medicine
